# Patient Record
Sex: Male | Born: 1955 | Race: Black or African American | Hispanic: No | Marital: Married | Smoking: Never smoker
Health system: Southern US, Community
[De-identification: ages and names within clinical notes are randomized; demographics above are authoritative.]

## PROBLEM LIST (undated history)

## (undated) DIAGNOSIS — M1990 Unspecified osteoarthritis, unspecified site: Secondary | ICD-10-CM

## (undated) DIAGNOSIS — C801 Malignant (primary) neoplasm, unspecified: Secondary | ICD-10-CM

## (undated) DIAGNOSIS — K4090 Unilateral inguinal hernia, without obstruction or gangrene, not specified as recurrent: Secondary | ICD-10-CM

## (undated) DIAGNOSIS — R05 Cough: Secondary | ICD-10-CM

## (undated) DIAGNOSIS — T464X5A Adverse effect of angiotensin-converting-enzyme inhibitors, initial encounter: Secondary | ICD-10-CM

## (undated) DIAGNOSIS — M545 Low back pain, unspecified: Secondary | ICD-10-CM

## (undated) DIAGNOSIS — R809 Proteinuria, unspecified: Secondary | ICD-10-CM

## (undated) DIAGNOSIS — I10 Essential (primary) hypertension: Secondary | ICD-10-CM

## (undated) DIAGNOSIS — T7840XA Allergy, unspecified, initial encounter: Secondary | ICD-10-CM

## (undated) DIAGNOSIS — Z91010 Allergy to peanuts: Secondary | ICD-10-CM

## (undated) DIAGNOSIS — E119 Type 2 diabetes mellitus without complications: Secondary | ICD-10-CM

## (undated) DIAGNOSIS — F419 Anxiety disorder, unspecified: Secondary | ICD-10-CM

## (undated) DIAGNOSIS — E785 Hyperlipidemia, unspecified: Secondary | ICD-10-CM

## (undated) HISTORY — DX: Essential (primary) hypertension: I10

## (undated) HISTORY — DX: Adverse effect of angiotensin-converting-enzyme inhibitors, initial encounter: T46.4X5A

## (undated) HISTORY — DX: Type 2 diabetes mellitus without complications: E11.9

## (undated) HISTORY — DX: Unspecified osteoarthritis, unspecified site: M19.90

## (undated) HISTORY — DX: Allergy, unspecified, initial encounter: T78.40XA

## (undated) HISTORY — DX: Cough: R05

## (undated) HISTORY — DX: Hyperlipidemia, unspecified: E78.5

## (undated) HISTORY — DX: Proteinuria, unspecified: R80.9

## (undated) HISTORY — DX: Low back pain, unspecified: M54.50

## (undated) HISTORY — DX: Low back pain: M54.5

---
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
## 1994-05-04 HISTORY — PX: OTHER SURGICAL HISTORY: SHX169

## 1996-05-04 HISTORY — PX: NOSE SURGERY: SHX723

## 1997-05-04 HISTORY — PX: HERNIA REPAIR: SHX51

## 1998-08-08 ENCOUNTER — Emergency Department (HOSPITAL_COMMUNITY): Admission: EM | Admit: 1998-08-08 | Discharge: 1998-08-08 | Payer: Self-pay | Admitting: Emergency Medicine

## 1998-08-30 ENCOUNTER — Ambulatory Visit (HOSPITAL_BASED_OUTPATIENT_CLINIC_OR_DEPARTMENT_OTHER): Admission: RE | Admit: 1998-08-30 | Discharge: 1998-08-30 | Payer: Self-pay | Admitting: General Surgery

## 2000-10-16 ENCOUNTER — Emergency Department (HOSPITAL_COMMUNITY): Admission: EM | Admit: 2000-10-16 | Discharge: 2000-10-16 | Payer: Self-pay | Admitting: Emergency Medicine

## 2002-02-14 ENCOUNTER — Encounter: Payer: Self-pay | Admitting: Emergency Medicine

## 2002-02-14 ENCOUNTER — Emergency Department (HOSPITAL_COMMUNITY): Admission: EM | Admit: 2002-02-14 | Discharge: 2002-02-14 | Payer: Self-pay | Admitting: Emergency Medicine

## 2003-02-16 ENCOUNTER — Ambulatory Visit (HOSPITAL_COMMUNITY): Admission: RE | Admit: 2003-02-16 | Discharge: 2003-02-16 | Payer: Self-pay | Admitting: Orthopedic Surgery

## 2003-02-16 ENCOUNTER — Ambulatory Visit (HOSPITAL_BASED_OUTPATIENT_CLINIC_OR_DEPARTMENT_OTHER): Admission: RE | Admit: 2003-02-16 | Discharge: 2003-02-16 | Payer: Self-pay | Admitting: Orthopedic Surgery

## 2004-05-04 HISTORY — PX: OTHER SURGICAL HISTORY: SHX169

## 2014-05-04 DIAGNOSIS — R058 Other specified cough: Secondary | ICD-10-CM

## 2014-05-04 DIAGNOSIS — R05 Cough: Secondary | ICD-10-CM | POA: Insufficient documentation

## 2014-05-04 DIAGNOSIS — T464X5A Adverse effect of angiotensin-converting-enzyme inhibitors, initial encounter: Secondary | ICD-10-CM

## 2014-05-04 HISTORY — DX: Adverse effect of angiotensin-converting-enzyme inhibitors, initial encounter: T46.4X5A

## 2014-05-04 HISTORY — DX: Other specified cough: R05.8

## 2014-11-02 DIAGNOSIS — I10 Essential (primary) hypertension: Secondary | ICD-10-CM | POA: Insufficient documentation

## 2014-11-02 DIAGNOSIS — E119 Type 2 diabetes mellitus without complications: Secondary | ICD-10-CM

## 2014-11-02 DIAGNOSIS — R809 Proteinuria, unspecified: Secondary | ICD-10-CM

## 2014-11-02 HISTORY — DX: Proteinuria, unspecified: R80.9

## 2014-11-02 HISTORY — DX: Essential (primary) hypertension: I10

## 2014-11-02 HISTORY — DX: Type 2 diabetes mellitus without complications: E11.9

## 2015-02-18 ENCOUNTER — Ambulatory Visit: Payer: Self-pay | Admitting: Internal Medicine

## 2015-07-16 ENCOUNTER — Ambulatory Visit: Payer: Self-pay | Admitting: Internal Medicine

## 2015-12-17 ENCOUNTER — Other Ambulatory Visit: Payer: Self-pay | Admitting: Internal Medicine

## 2017-11-26 ENCOUNTER — Other Ambulatory Visit: Payer: Self-pay

## 2017-11-26 ENCOUNTER — Emergency Department (HOSPITAL_COMMUNITY): Payer: Self-pay

## 2017-11-26 ENCOUNTER — Emergency Department (HOSPITAL_COMMUNITY)
Admission: EM | Admit: 2017-11-26 | Discharge: 2017-11-26 | Disposition: A | Discharge status: Home / Self Care | Payer: Self-pay | Attending: Emergency Medicine | Admitting: Emergency Medicine

## 2017-11-26 ENCOUNTER — Encounter (HOSPITAL_COMMUNITY): Payer: Self-pay | Admitting: Emergency Medicine

## 2017-11-26 DIAGNOSIS — K469 Unspecified abdominal hernia without obstruction or gangrene: Secondary | ICD-10-CM | POA: Insufficient documentation

## 2017-11-26 LAB — COMPREHENSIVE METABOLIC PANEL
ALBUMIN: 4.4 g/dL (ref 3.5–5.0)
ALK PHOS: 53 U/L (ref 38–126)
ALT: 34 U/L (ref 0–44)
AST: 22 U/L (ref 15–41)
Anion gap: 10 (ref 5–15)
BILIRUBIN TOTAL: 1.1 mg/dL (ref 0.3–1.2)
BUN: 8 mg/dL (ref 8–23)
CALCIUM: 9.5 mg/dL (ref 8.9–10.3)
CO2: 25 mmol/L (ref 22–32)
Chloride: 105 mmol/L (ref 98–111)
Creatinine, Ser: 1 mg/dL (ref 0.61–1.24)
GFR calc non Af Amer: >60 mL/min (ref >60)
GLUCOSE: 174 mg/dL — AB (ref 70–99)
Potassium: 4.1 mmol/L (ref 3.5–5.1)
SODIUM: 140 mmol/L (ref 135–145)
Total Protein: 7.6 g/dL (ref 6.5–8.1)

## 2017-11-26 LAB — URINALYSIS, ROUTINE W REFLEX MICROSCOPIC
BILIRUBIN URINE: NEGATIVE
Glucose, UA: 150 mg/dL — AB
HGB URINE DIPSTICK: NEGATIVE
Ketones, ur: NEGATIVE mg/dL
Leukocytes, UA: NEGATIVE
Nitrite: NEGATIVE
PROTEIN: NEGATIVE mg/dL
Specific Gravity, Urine: 1.016 (ref 1.005–1.030)
pH: 5 (ref 5.0–8.0)

## 2017-11-26 LAB — LIPASE, BLOOD: Lipase: 43 U/L (ref 11–51)

## 2017-11-26 LAB — CBC
HCT: 47.7 % (ref 39.0–52.0)
Hemoglobin: 15.5 g/dL (ref 13.0–17.0)
MCH: 31.1 pg (ref 26.0–34.0)
MCHC: 32.5 g/dL (ref 30.0–36.0)
MCV: 95.8 fL (ref 78.0–100.0)
PLATELETS: 206 10*3/uL (ref 150–400)
RBC: 4.98 MIL/uL (ref 4.22–5.81)
RDW: 11.9 % (ref 11.5–15.5)
WBC: 6.9 10*3/uL (ref 4.0–10.5)

## 2017-11-26 MED ORDER — DICYCLOMINE HCL 10 MG PO CAPS
10.0000 mg | ORAL_CAPSULE | Freq: Once | ORAL | Status: AC
  Administered 2017-11-26: 10 mg via ORAL
  Filled 2017-11-26: qty 1

## 2017-11-26 MED ORDER — ACETAMINOPHEN 325 MG PO TABS
650.0000 mg | ORAL_TABLET | Freq: Once | ORAL | Status: AC
  Administered 2017-11-26: 650 mg via ORAL
  Filled 2017-11-26: qty 2

## 2017-11-26 MED ORDER — IOHEXOL 300 MG/ML  SOLN
100.0000 mL | Freq: Once | INTRAMUSCULAR | Status: AC | PRN
  Administered 2017-11-26: 100 mL via INTRAVENOUS

## 2017-11-26 MED ORDER — DICYCLOMINE HCL 20 MG PO TABS: 20.0000 mg | ORAL_TABLET | Freq: Two times a day (BID) | ORAL | 0 refills | Status: DC

## 2017-11-26 NOTE — ED Provider Notes (Signed)
[facility] Provider Note   CSN: [id number] Arrival date & time: 11/26/17  3557     History   Chief Complaint Chief Complaint  Patient presents with  . Abdominal Pain    HPI Spencer Walton is a 62 y.o. male.  The history is provided by the patient and the spouse. No language interpreter was used.  Abdominal Pain       62 year old male with prior history of hernia repair presenting for evaluation of abdominal pain.  Patient patient report for more than a year, he has had recurrent discomfort to his left lower abdomen.  It happens almost on a daily basis where he felt something is pushing rubbing the side of his abdomen because of burning sensation.  Pain is waxing waning, but not worse than usual.  There is no associated fever, chills, nausea, vomiting, diarrhea, constipation, dysuria or pain with bowel movement.  No specific treatment tried at home aside from occasional ibuprofen.  He mentioned having an abdominal hernia repair many years ago and he is unsure the site of the hernia.  He is here at the urging of his wife who has been telling him to be evaluated for this condition 4 months.  He does not have a primary care provider.  He denies alcohol abuse or tobacco abuse.  He denies any abnormal weight changes, night sweats, or fever.  No history of cancer.  History reviewed. No pertinent past medical history.  There are no active problems to display for this patient.   Past Surgical History:  Procedure Laterality Date  . HERNIA REPAIR          Home Medications    Prior to Admission medications   Medication Sig Start Date End Date Taking? Authorizing Provider  ibuprofen (ADVIL,MOTRIN) 200 MG tablet Take 200 mg by mouth every 6 (six) hours as needed for moderate pain.   Yes [provider]    Family History No family history on file.  Social History Social History   Tobacco Use  . Smoking status: Not on file    Substance Use Topics  . Alcohol use: Not on file  . Drug use: Not on file     Allergies   Penicillins   Review of Systems Review of Systems  Gastrointestinal: Positive for abdominal pain.  All other systems reviewed and are negative.    Physical Exam Updated Vital Signs BP (!) 182/103 (BP Location: Right Arm)   Pulse (!) 101   Temp 98.9 F (37.2 C) (Oral)   Resp 18   Ht 6' (1.829 m)   Wt 86.2 kg (190 lb)   SpO2 99%   BMI 25.77 kg/m   Physical Exam  Constitutional: He appears well-developed and well-nourished. No distress.  HENT:  Head: Atraumatic.  Eyes: Conjunctivae are normal.  Neck: Neck supple.  Cardiovascular: Normal rate and regular rhythm.  Pulmonary/Chest: Effort normal and breath sounds normal.  Abdominal: Normal appearance. There is tenderness in the left lower quadrant. No hernia. Hernia confirmed negative in the right inguinal area and confirmed negative in the left inguinal area.  Protuberant abdomen without significant focal tenderness.  He exhibited very mild tenderness to his left lower abdominal wall  Genitourinary:  Genitourinary Comments: Chaperone present during exam.  No inguinal lymph adenopathy or inguinal hernia noted.  Uncircumcised penis free of lesion or rash.  Testicle are nontender with normal lie, normal scrotum.  Neurological: He is alert.  Skin: No rash noted.  Psychiatric:  He has a normal mood and affect.  Nursing note and vitals reviewed.    ED Treatments / Results  Labs (all labs ordered are listed, but only abnormal results are displayed) Labs Reviewed  COMPREHENSIVE METABOLIC PANEL - Abnormal; Notable for the following components:      Result Value   Glucose, Bld 174 (*)    All other components within normal limits  URINALYSIS, ROUTINE W REFLEX MICROSCOPIC - Abnormal; Notable for the following components:   Glucose, UA 150 (*)    All other components within normal limits  LIPASE, BLOOD  CBC    EKG None     Radiology Ct Abdomen Pelvis W Contrast  Result Date: 11/26/2017 CLINICAL DATA:  Left-sided abdominal pain for the past year, worse during the past month. EXAM: CT ABDOMEN AND PELVIS WITH CONTRAST TECHNIQUE: Multidetector CT imaging of the abdomen and pelvis was performed using the standard protocol following bolus administration of intravenous contrast. CONTRAST:  173mL OMNIPAQUE IOHEXOL 300 MG/ML  SOLN COMPARISON:  None. FINDINGS: Lower chest: Limited visualization of the lower thorax demonstrates minimal dependent subpleural ground-glass atelectasis. No focal airspace opacities. No pleural effusion. Normal heart size.  No pericardial effusion. Hepatobiliary: Normal hepatic contour. No discrete hepatic lesions. Normal appearance of the gallbladder given degree of distention. No intra or extrahepatic biliary ductal dilatation. No ascites. Pancreas: Normal appearance of the pancreas Spleen: Normal appearance of the spleen Adrenals/Urinary Tract: There is symmetric enhancement and excretion of the bilateral kidneys. No definite renal stones on this postcontrast examination. Punctate subcentimeter right-sided renal lesion is too small to adequately characterize of favored to represent a renal cyst. No discrete left-sided renal lesions. There is a minimal amount of presumably age and body habitus related perinephric stranding. No urinary obstruction. Normal appearance the bilateral adrenal glands. Normal appearance of the urinary bladder given degree distention. Stomach/Bowel: Nonobstructive bowel gas pattern. Normal appearance of the terminal ileum and retrocecal appendix. No pneumoperitoneum, pneumatosis or portal venous gas. Vascular/Lymphatic: Scattered atherosclerotic plaque within a normal caliber abdominal aorta, not resulting in a hemodynamically significant stenosis. The major branch vessels of the abdominal aorta appear widely patent on this non CTA examination. No bulky retroperitoneal, mesenteric,  pelvic or inguinal lymphadenopathy. Reproductive: Dystrophic calcifications within a borderline enlarged prostate gland. No free fluid within the pelvic cul-de-sac. Other: Small left-sided mesenteric fat containing indirect inguinal hernia (images 77 through 89, series 3). Musculoskeletal: No acute or aggressive osseous abnormalities. Mild (approximately 25%) compression deformity involving the anterior aspect of the inferior endplate of the L1 vertebral body without associated fracture line or paraspinal hematoma. There is partial ankylosis of the L4-L5 intervertebral disc space. Stigmata of DISH within the thoracic spine. Moderate severe degenerative change the bilateral hips with joint space loss, subchondral sclerosis, osteophytosis and minimal amount of grossly symmetric bilateral axial migration. Note is made of a small right-sided os acetabuli. IMPRESSION: 1. Small left-sided mesenteric fat containing inguinal hernia. Otherwise, no explanation for patient's left sided abdominal pain. 2.  Aortic Atherosclerosis (ICD10-I70.0). Electronically Signed   By: Sandi Mariscal M.D.   On: 11/26/2017 13:04    Procedures Procedures (including critical care time)  Medications Ordered in ED Medications  iohexol (OMNIPAQUE) 300 MG/ML solution 100 mL (100 mLs Intravenous Contrast Given 11/26/17 1244)  dicyclomine (BENTYL) capsule 10 mg (10 mg Oral Given 11/26/17 1333)  acetaminophen (TYLENOL) tablet 650 mg (650 mg Oral Given 11/26/17 1333)     Initial Impression / Assessment and Plan / ED Course  I have reviewed  the triage vital signs and the nursing notes.  Pertinent labs & imaging results that were available during my care of the patient were reviewed by me and considered in my medical decision making (see chart for details).     BP (!) 137/92   Pulse 83   Temp 98.9 F (37.2 C) (Oral)   Resp 18   Ht 6' (1.829 m)   Wt 86.2 kg (190 lb)   SpO2 96%   BMI 25.77 kg/m    Final Clinical Impressions(s) /  ED Diagnoses   Final diagnoses:  None    ED Discharge Orders    None     9:28 AM Patient with recurrent left lower abdominal pain for more than a year, history of hernia in the past of unusual location.  I query if this is an irritation of the hernia mesh from prior hernia repair.  He does not have any acute symptoms concerning for small bowel obstruction or infectious symptoms. He does not have any B symptoms.  Care discussed with DR. Sabra Heck, plan to obtain abd/pelvis CT  1:29 PM Patient's labs are reassuring, normal WBC, normal H&H, electrolytes panels are reassuring, mild hyper glycemia with a CBG of 174, normal lipase.  An abdominal and pelvis CT scan demonstrate a small left-sided mesenteric fat-containing inguinal hernia but otherwise no examination for patient's left-sided abdominal pain.  Patient made aware of findings.  No acute emergent medical condition identified.  Encourage patient to follow-up with primary care provider for further evaluation of his condition.  Bentyl prescribed to use as needed.  Recommend alcohol cessation.   Domenic Moras, PA-C 11/26/17 1420    Noemi Chapel, MD 11/27/17 2130

## 2017-11-26 NOTE — ED Triage Notes (Signed)
Patient complains of left sided abdominal pain for the last year, states pain got significant worse over the last month. Reports history of hernia repair, patient unable to recall location and year of repair. Patient alert, oriented, and ambulating independently with steady gait.

## 2018-01-07 ENCOUNTER — Ambulatory Visit: Payer: Self-pay | Admitting: Internal Medicine

## 2018-01-07 ENCOUNTER — Encounter: Payer: Self-pay | Admitting: Internal Medicine

## 2018-01-07 VITALS — BP 180/100 | HR 84 | Resp 12 | Ht 69.5 in | Wt 200.0 lb

## 2018-01-07 DIAGNOSIS — M545 Low back pain, unspecified: Secondary | ICD-10-CM | POA: Insufficient documentation

## 2018-01-07 DIAGNOSIS — I10 Essential (primary) hypertension: Secondary | ICD-10-CM

## 2018-01-07 DIAGNOSIS — E1169 Type 2 diabetes mellitus with other specified complication: Secondary | ICD-10-CM

## 2018-01-07 DIAGNOSIS — T464X5A Adverse effect of angiotensin-converting-enzyme inhibitors, initial encounter: Secondary | ICD-10-CM

## 2018-01-07 DIAGNOSIS — G8929 Other chronic pain: Secondary | ICD-10-CM

## 2018-01-07 DIAGNOSIS — K4090 Unilateral inguinal hernia, without obstruction or gangrene, not specified as recurrent: Secondary | ICD-10-CM

## 2018-01-07 DIAGNOSIS — E669 Obesity, unspecified: Secondary | ICD-10-CM

## 2018-01-07 DIAGNOSIS — R05 Cough: Secondary | ICD-10-CM

## 2018-01-07 DIAGNOSIS — R809 Proteinuria, unspecified: Secondary | ICD-10-CM

## 2018-01-07 DIAGNOSIS — R058 Other specified cough: Secondary | ICD-10-CM

## 2018-01-07 LAB — GLUCOSE, POCT (MANUAL RESULT ENTRY): POC Glucose: 168 mg/dl — AB (ref 70–99)

## 2018-01-07 MED ORDER — CYCLOBENZAPRINE HCL 10 MG PO TABS
10.0000 mg | ORAL_TABLET | Freq: Three times a day (TID) | ORAL | 1 refills | Status: DC | PRN
Start: 2018-01-07 — End: 2018-10-11

## 2018-01-07 MED ORDER — LOSARTAN POTASSIUM 50 MG PO TABS: 50.0000 mg | ORAL_TABLET | Freq: Every day | ORAL | 11 refills | Status: DC

## 2018-01-07 MED ORDER — DICYCLOMINE HCL 20 MG PO TABS: ORAL_TABLET | ORAL | 1 refills | Status: DC

## 2018-01-07 MED ORDER — METFORMIN HCL 500 MG PO TABS: ORAL_TABLET | ORAL | 11 refills | Status: DC

## 2018-01-07 NOTE — Progress Notes (Signed)
Patient ID: Spencer Walton, male   DOB: [birth year], 62 y.o.   MRN: [id number]  Social work Administrator completed new patient screening with patient in order to assess for mental health concerns and/or problems with social determinants of health (food, housing, transportation, interpersonal violence). Pt reported that he had no issues with any SDOH. He denied any mental health symptoms besides feeling nervous about seeing the doctor.   No follow-up needed at this time.
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
## 2018-01-18 ENCOUNTER — Encounter: Payer: Self-pay | Admitting: Internal Medicine

## 2018-02-04 ENCOUNTER — Other Ambulatory Visit: Payer: Self-pay

## 2018-02-04 VITALS — BP 124/80 | HR 72

## 2018-02-04 DIAGNOSIS — I10 Essential (primary) hypertension: Secondary | ICD-10-CM

## 2018-02-04 NOTE — Progress Notes (Signed)
Patient BP now in normal range. Informed to continue current dose of medication. Patient verbalized understanding. 

## 2018-02-05 LAB — BASIC METABOLIC PANEL
BUN / CREAT RATIO: 13 (ref 10–24)
BUN: 13 mg/dL (ref 8–27)
CALCIUM: 9.7 mg/dL (ref 8.6–10.2)
CO2: 21 mmol/L (ref 20–29)
Chloride: 98 mmol/L (ref 96–106)
Creatinine, Ser: 0.97 mg/dL (ref 0.76–1.27)
GFR, EST AFRICAN AMERICAN: 97 mL/min/{1.73_m2} (ref >59)
GFR, EST NON AFRICAN AMERICAN: 84 mL/min/{1.73_m2} (ref >59)
Glucose: 178 mg/dL — ABNORMAL HIGH (ref 65–99)
POTASSIUM: 4.4 mmol/L (ref 3.5–5.2)
Sodium: 139 mmol/L (ref 134–144)

## 2018-03-04 ENCOUNTER — Ambulatory Visit: Payer: Self-pay | Admitting: Internal Medicine

## 2018-05-05 ENCOUNTER — Other Ambulatory Visit: Payer: Self-pay

## 2018-05-05 ENCOUNTER — Emergency Department (HOSPITAL_COMMUNITY)
Admission: EM | Admit: 2018-05-05 | Discharge: 2018-05-06 | Disposition: A | Discharge status: Home / Self Care | Payer: Medicaid Other | Attending: Emergency Medicine | Admitting: Emergency Medicine

## 2018-05-05 DIAGNOSIS — E119 Type 2 diabetes mellitus without complications: Secondary | ICD-10-CM | POA: Diagnosis not present

## 2018-05-05 DIAGNOSIS — I10 Essential (primary) hypertension: Secondary | ICD-10-CM | POA: Insufficient documentation

## 2018-05-05 DIAGNOSIS — T782XXA Anaphylactic shock, unspecified, initial encounter: Secondary | ICD-10-CM

## 2018-05-05 DIAGNOSIS — T7840XA Allergy, unspecified, initial encounter: Secondary | ICD-10-CM | POA: Diagnosis present

## 2018-05-05 DIAGNOSIS — Z79899 Other long term (current) drug therapy: Secondary | ICD-10-CM | POA: Insufficient documentation

## 2018-05-05 MED ORDER — FAMOTIDINE IN NACL 20-0.9 MG/50ML-% IV SOLN
20.0000 mg | Freq: Once | INTRAVENOUS | Status: AC
  Administered 2018-05-05: 20 mg via INTRAVENOUS
  Filled 2018-05-05: qty 50

## 2018-05-05 MED ORDER — METHYLPREDNISOLONE SODIUM SUCC 125 MG IJ SOLR
125.0000 mg | Freq: Once | INTRAMUSCULAR | Status: AC
  Administered 2018-05-05: 125 mg via INTRAVENOUS
  Filled 2018-05-05: qty 2

## 2018-05-05 MED ORDER — DIPHENHYDRAMINE HCL 50 MG/ML IJ SOLN
25.0000 mg | Freq: Once | INTRAMUSCULAR | Status: AC
  Administered 2018-05-05: 25 mg via INTRAVENOUS
  Filled 2018-05-05: qty 1

## 2018-05-05 MED ORDER — SODIUM CHLORIDE 0.9 % IV BOLUS
1000.0000 mL | Freq: Once | INTRAVENOUS | Status: AC
  Administered 2018-05-05: 1000 mL via INTRAVENOUS

## 2018-05-05 NOTE — ED Triage Notes (Signed)
Pt coming by EMS after allergic reaction to eating pecans ([employer] candy bar). Pt developed hives, lip, throat and tongue swelling. Pt was unaware of allergy. Gave PO Benadryl on site by EMS, with no relief then given 0.3 of Epi and 50 mg IV of Benadryl. Hypertensive but has been off BP meds for several days due to no transportation to pharmacy. Pt alert, oriented, and airway is secure.

## 2018-05-05 NOTE — Discharge Instructions (Addendum)
Take the prescribed medication as directed.  Try and get to the pharmacy to pick up your BP medications. If epi-pen used, you need to come to the ED for evaluation. Follow-up with your primary care doctor. Return to the ED for new or worsening symptoms.
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
## 2018-05-06 LAB — BASIC METABOLIC PANEL
ANION GAP: 11 (ref 5–15)
BUN: 13 mg/dL (ref 8–23)
CHLORIDE: 103 mmol/L (ref 98–111)
CO2: 21 mmol/L — ABNORMAL LOW (ref 22–32)
Calcium: 8.9 mg/dL (ref 8.9–10.3)
Creatinine, Ser: 0.91 mg/dL (ref 0.61–1.24)
GFR calc Af Amer: >60 mL/min (ref >60)
Glucose, Bld: 222 mg/dL — ABNORMAL HIGH (ref 70–99)
POTASSIUM: 3.9 mmol/L (ref 3.5–5.1)
SODIUM: 135 mmol/L (ref 135–145)

## 2018-05-06 LAB — CBC
HEMATOCRIT: 45.1 % (ref 39.0–52.0)
HEMOGLOBIN: 14.9 g/dL (ref 13.0–17.0)
MCH: 30.7 pg (ref 26.0–34.0)
MCHC: 33 g/dL (ref 30.0–36.0)
MCV: 92.8 fL (ref 80.0–100.0)
Platelets: 220 10*3/uL (ref 150–400)
RBC: 4.86 MIL/uL (ref 4.22–5.81)
RDW: 11.9 % (ref 11.5–15.5)
WBC: 12.5 10*3/uL — AB (ref 4.0–10.5)
nRBC: 0 % (ref 0.0–0.2)

## 2018-05-06 MED ORDER — LOSARTAN POTASSIUM 50 MG PO TABS
50.0000 mg | ORAL_TABLET | Freq: Once | ORAL | Status: AC
  Administered 2018-05-06: 50 mg via ORAL
  Filled 2018-05-06 (×2): qty 1

## 2018-05-06 MED ORDER — EPINEPHRINE 0.3 MG/0.3ML IJ SOAJ: 0.3000 mg | Freq: Once | INTRAMUSCULAR | 1 refills | Status: DC | PRN

## 2018-05-06 MED ORDER — PREDNISONE 20 MG PO TABS: ORAL_TABLET | ORAL | 0 refills | Status: DC

## 2018-05-06 NOTE — ED Notes (Signed)
Patient verbalizes understanding of discharge instructions. Opportunity for questioning and answers were provided. Armband removed by staff, pt discharged from ED ambulatory.   
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
## 2018-06-30 ENCOUNTER — Ambulatory Visit: Payer: Self-pay | Admitting: Surgery

## 2018-06-30 NOTE — H&P (Signed)
History of Present Illness Spencer Walton. Spencer Walton; 06/30/2018 6:52 PM) The patient is a 63 year old male who presents with an inguinal hernia. Referred by [name] for left inguinal hernia  This is a 63 year old male who is status post right inguinal hernia repair many years ago who presents with several years of slowly worsening left lower abdominal/left groin pain. He underwent a CT scan in July 2019 which showed a small left inguinal hernia containing some fat. On review of the CT scan, the patient also has a small umbilical hernia. The patient has had previous back surgery and continues to have a lot of pain coming from his back. He denies any obstructive symptoms. He presents now to discuss hernia repair.  CLINICAL DATA: Left-sided abdominal pain for the past year, worse during the past month.  EXAM: CT ABDOMEN AND PELVIS WITH CONTRAST  TECHNIQUE: Multidetector CT imaging of the abdomen and pelvis was performed using the standard protocol following bolus administration of intravenous contrast.  CONTRAST: 134mL OMNIPAQUE IOHEXOL 300 MG/ML SOLN  COMPARISON: None.  FINDINGS: Lower chest: Limited visualization of the lower thorax demonstrates minimal dependent subpleural ground-glass atelectasis. No focal airspace opacities. No pleural effusion.  Normal heart size. No pericardial effusion.  Hepatobiliary: Normal hepatic contour. No discrete hepatic lesions. Normal appearance of the gallbladder given degree of distention. No intra or extrahepatic biliary ductal dilatation. No ascites.  Pancreas: Normal appearance of the pancreas  Spleen: Normal appearance of the spleen  Adrenals/Urinary Tract: There is symmetric enhancement and excretion of the bilateral kidneys. No definite renal stones on this postcontrast examination. Punctate subcentimeter right-sided renal lesion is too small to adequately characterize of favored to represent a renal cyst. No discrete  left-sided renal lesions. There is a minimal amount of presumably age and body habitus related perinephric stranding. No urinary obstruction.  Normal appearance the bilateral adrenal glands. Normal appearance of the urinary bladder given degree distention.  Stomach/Bowel: Nonobstructive bowel gas pattern. Normal appearance of the terminal ileum and retrocecal appendix. No pneumoperitoneum, pneumatosis or portal venous gas.  Vascular/Lymphatic: Scattered atherosclerotic plaque within a normal caliber abdominal aorta, not resulting in a hemodynamically significant stenosis. The major branch vessels of the abdominal aorta appear widely patent on this non CTA examination.  No bulky retroperitoneal, mesenteric, pelvic or inguinal lymphadenopathy.  Reproductive: Dystrophic calcifications within a borderline enlarged prostate gland. No free fluid within the pelvic cul-de-sac.  Other: Small left-sided mesenteric fat containing indirect inguinal hernia (images 77 through 89, series 3).  Musculoskeletal: No acute or aggressive osseous abnormalities. Mild (approximately 25%) compression deformity involving the anterior aspect of the inferior endplate of the L1 vertebral body without associated fracture line or paraspinal hematoma. There is partial ankylosis of the L4-L5 intervertebral disc space. Stigmata of DISH within the thoracic spine. Moderate severe degenerative change the bilateral hips with joint space loss, subchondral sclerosis, osteophytosis and minimal amount of grossly symmetric bilateral axial migration. Note is made of a small right-sided os acetabuli.  IMPRESSION: 1. Small left-sided mesenteric fat containing inguinal hernia. Otherwise, no explanation for patient's left sided abdominal pain. 2. Aortic Atherosclerosis (ICD10-I70.0).   Electronically Signed By: Sandi Mariscal M.D. On: 11/26/2017 13:04   Past Surgical History Spencer Walton, RMA; 06/30/2018 3:32  PM) Open Inguinal Hernia Surgery Left. Spinal Surgery Midback  Diagnostic Studies History Spencer Walton, RMA; 06/30/2018 3:32 PM) Colonoscopy never  Allergies Spencer Walton, RMA; 06/30/2018 3:33 PM) Penicillins Swelling. Allergies Reconciled  Medication History [employer], RMA; 06/30/2018 3:33 PM) Losartan Potassium (  50MG  Tablet, Oral) Active. metFORMIN HCl (500MG  Tablet, Oral) Active. Medications Reconciled  Social History Spencer Walton, RMA; 06/30/2018 3:32 PM) Alcohol use Moderate alcohol use. No caffeine use No drug use Tobacco use Never smoker.  Family History Spencer Walton, RMA; 06/30/2018 3:32 PM) Alcohol Abuse Father. Arthritis Father, Mother. Colon Cancer Mother. Colon Polyps Mother, Sister. Hypertension Father, Mother.  Other Problems Spencer Walton, RMA; 06/30/2018 3:32 PM) Back Pain Diabetes Mellitus Hemorrhoids High blood pressure Inguinal Hernia Umbilical Hernia Repair Ventral Hernia Repair     Review of Systems (Spencer Walton RMA; 06/30/2018 3:32 PM) General Not Present- Appetite Loss, Chills, Fatigue, Fever, Night Sweats, Weight Gain and Weight Loss. Skin Present- Change in Wart/Mole and Non-Healing Wounds. Not Present- Dryness, Hives, Jaundice, New Lesions, Rash and Ulcer. HEENT Present- Earache, Hoarseness and Wears glasses/contact lenses. Not Present- Hearing Loss, Nose Bleed, Oral Ulcers, Ringing in the Ears, Seasonal Allergies, Sinus Pain, Sore Throat, Visual Disturbances and Yellow Eyes. Respiratory Present- Difficulty Breathing. Not Present- Bloody sputum, Chronic Cough, Snoring and Wheezing. Cardiovascular Present- Leg Cramps. Not Present- Chest Pain, Difficulty Breathing Lying Down, Palpitations, Rapid Heart Rate, Shortness of Breath and Swelling of Extremities. Gastrointestinal Present- Abdominal Pain, Bloating, Excessive gas, Indigestion and Nausea. Not Present- Bloody Stool, Change  in Bowel Habits, Chronic diarrhea, Constipation, Difficulty Swallowing, Gets full quickly at meals, Hemorrhoids, Rectal Pain and Vomiting. Male Genitourinary Present- Frequency, Impotence, Urgency and Urine Leakage. Not Present- Blood in Urine, Change in Urinary Stream, Nocturia and Painful Urination. Musculoskeletal Present- Back Pain, Joint Pain, Joint Stiffness and Muscle Pain. Not Present- Muscle Weakness and Swelling of Extremities. Neurological Present- Decreased Memory, Headaches, Numbness, Tingling, Trouble walking and Weakness. Not Present- Fainting, Seizures and Tremor. Psychiatric Present- Anxiety and Fearful. Not Present- Bipolar, Change in Sleep Pattern, Depression and Frequent crying. Endocrine Present- New Diabetes. Not Present- Cold Intolerance, Excessive Hunger, Hair Changes, Heat Intolerance and Hot flashes. Hematology Not Present- Blood Thinners, Easy Bruising, Excessive bleeding, Gland problems, HIV and Persistent Infections.  Vitals Spencer Walton RMA; 06/30/2018 3:34 PM) 06/30/2018 3:33 PM Weight: 213.2 lb Height: 72in Body Surface Area: 2.19 m Body Mass Index: 28.91 kg/m  Temp.: 97.72F(Temporal)  Pulse: 106 (Regular)  P.OX: 98% (Room air) BP: 138/100 (Sitting, Left Arm, Standard)      Physical Exam Rodman Key K. Venesa Semidey Walton; 06/30/2018 6:53 PM)  The physical exam findings are as follows: Note:WDWN in NAD Eyes: Pupils equal, round; sclera anicteric HENT: Oral mucosa moist; good dentition Neck: No masses palpated, no thyromegaly Lungs: CTA bilaterally; normal respiratory effort CV: Regular rate and rhythm; no murmurs; extremities well-perfused with no edema Abd: +bowel sounds, soft, non-tender, no palpable organomegaly; small reducible umbilical hernia with 1 cm defect GU: bilateral descended testes; no testicular masses. Small reducible left inguinal hernia. Healed right inguinal incision with no sign of hernia Skin: Warm, dry; no sign of  jaundice Psychiatric - alert and oriented x 4; calm mood and affect    Assessment & Plan Rodman Key K. Kanylah Muench Walton; 06/30/2018 4:22 PM)  INGUINAL HERNIA OF LEFT SIDE WITHOUT OBSTRUCTION OR GANGRENE (L27.51)   UMBILICAL HERNIA WITHOUT OBSTRUCTION OR GANGRENE (K42.9)  Current Plans Schedule for Surgery - Left inguinal hernia repair with mesh. Umbilical hernia repair with mesh. The surgical procedure has been discussed with the patient. Potential risks, benefits, alternative treatments, and expected outcomes have been explained. All of the patient's questions at this time have been answered. The likelihood of reaching the patient's treatment goal is good. The patient understand the proposed surgical procedure and  wishes to proceed.  Spencer Walton. Georgette Dover, [place], [facility] Beeper [phone]  06/30/2018 6:53 PM

## 2018-07-05 ENCOUNTER — Ambulatory Visit: Payer: Self-pay | Admitting: Internal Medicine

## 2018-07-06 ENCOUNTER — Encounter (HOSPITAL_BASED_OUTPATIENT_CLINIC_OR_DEPARTMENT_OTHER): Payer: Self-pay | Admitting: *Deleted

## 2018-07-06 ENCOUNTER — Other Ambulatory Visit: Payer: Self-pay

## 2018-07-11 ENCOUNTER — Encounter (HOSPITAL_BASED_OUTPATIENT_CLINIC_OR_DEPARTMENT_OTHER)
Admission: RE | Admit: 2018-07-11 | Discharge: 2018-07-11 | Disposition: A | Discharge status: Home / Self Care | Payer: Self-pay | Source: Ambulatory Visit | Attending: Surgery | Admitting: Surgery

## 2018-07-11 ENCOUNTER — Encounter (HOSPITAL_BASED_OUTPATIENT_CLINIC_OR_DEPARTMENT_OTHER): Payer: Self-pay | Admitting: Anesthesiology

## 2018-07-11 DIAGNOSIS — K429 Umbilical hernia without obstruction or gangrene: Secondary | ICD-10-CM | POA: Diagnosis not present

## 2018-07-11 DIAGNOSIS — Z539 Procedure and treatment not carried out, unspecified reason: Secondary | ICD-10-CM | POA: Diagnosis not present

## 2018-07-11 DIAGNOSIS — K4090 Unilateral inguinal hernia, without obstruction or gangrene, not specified as recurrent: Secondary | ICD-10-CM | POA: Diagnosis not present

## 2018-07-11 LAB — BASIC METABOLIC PANEL
Anion gap: 9 (ref 5–15)
BUN: 14 mg/dL (ref 8–23)
CO2: 22 mmol/L (ref 22–32)
Calcium: 9.5 mg/dL (ref 8.9–10.3)
Chloride: 104 mmol/L (ref 98–111)
Creatinine, Ser: 0.83 mg/dL (ref 0.61–1.24)
GFR calc Af Amer: >60 mL/min (ref >60)
GFR calc non Af Amer: >60 mL/min (ref >60)
Glucose, Bld: 137 mg/dL — ABNORMAL HIGH (ref 70–99)
POTASSIUM: 4.2 mmol/L (ref 3.5–5.1)
Sodium: 135 mmol/L (ref 135–145)

## 2018-07-11 NOTE — Progress Notes (Signed)
Ensure pre surgery drink given with instructions to complete by 0700 dos, surgical soap given with instruction, pt verbalized understanding. Unable to obtain Anesthesia consult due to surgery schedule today, will be evaluated on day of surgery.

## 2018-07-11 NOTE — Anesthesia Preprocedure Evaluation (Deleted)
Anesthesia Evaluation    Reviewed: Allergy & Precautions, Patient's Chart, lab work & pertinent test results  Airway        Dental   Pulmonary neg pulmonary ROS,           Cardiovascular Exercise Tolerance: Good hypertension, Pt. on medications      Neuro/Psych negative neurological ROS  negative psych ROS   GI/Hepatic negative GI ROS, Neg liver ROS,   Endo/Other  diabetes, Well Controlled, Type 2  Renal/GU negative Renal ROS     Musculoskeletal   Abdominal   Peds  Hematology   Anesthesia Other Findings   Reproductive/Obstetrics                             Anesthesia Physical Anesthesia Plan  ASA: III  Anesthesia Plan: General   Post-op Pain Management:    Induction: Intravenous  PONV Risk Score and Plan: 3 and Treatment may vary due to age or medical condition, Ondansetron and Dexamethasone  Airway Management Planned: Oral ETT  Additional Equipment:   Intra-op Plan:   Post-operative Plan: Extubation in OR  Informed Consent:     Dental advisory given  Plan Discussed with:   Anesthesia Plan Comments:         Anesthesia Quick Evaluation

## 2018-07-12 ENCOUNTER — Encounter (HOSPITAL_BASED_OUTPATIENT_CLINIC_OR_DEPARTMENT_OTHER)
Admission: RE | Disposition: A | Discharge status: Home / Self Care | Payer: Self-pay | Source: Home / Self Care | Attending: Surgery

## 2018-07-12 ENCOUNTER — Ambulatory Visit (HOSPITAL_BASED_OUTPATIENT_CLINIC_OR_DEPARTMENT_OTHER)
Admission: RE | Admit: 2018-07-12 | Discharge: 2018-07-12 | Disposition: A | Discharge status: Home / Self Care | Payer: Medicaid Other | Attending: Surgery | Admitting: Surgery

## 2018-07-12 DIAGNOSIS — K4090 Unilateral inguinal hernia, without obstruction or gangrene, not specified as recurrent: Secondary | ICD-10-CM | POA: Insufficient documentation

## 2018-07-12 DIAGNOSIS — Z539 Procedure and treatment not carried out, unspecified reason: Secondary | ICD-10-CM | POA: Insufficient documentation

## 2018-07-12 DIAGNOSIS — K429 Umbilical hernia without obstruction or gangrene: Secondary | ICD-10-CM | POA: Insufficient documentation

## 2018-07-12 HISTORY — DX: Unilateral inguinal hernia, without obstruction or gangrene, not specified as recurrent: K40.90

## 2018-07-12 SURGERY — REPAIR, HERNIA, INGUINAL, ADULT
Anesthesia: General

## 2018-07-12 MED ORDER — FENTANYL CITRATE (PF) 100 MCG/2ML IJ SOLN: 50.0000 ug | INTRAMUSCULAR | Status: DC | PRN

## 2018-07-12 MED ORDER — FENTANYL CITRATE (PF) 100 MCG/2ML IJ SOLN
INTRAMUSCULAR | Status: AC
  Filled 2018-07-12: qty 2

## 2018-07-12 MED ORDER — SCOPOLAMINE 1 MG/3DAYS TD PT72: 1.0000 | MEDICATED_PATCH | Freq: Once | TRANSDERMAL | Status: DC | PRN

## 2018-07-12 MED ORDER — LACTATED RINGERS IV SOLN: INTRAVENOUS | Status: DC

## 2018-07-12 MED ORDER — DEXAMETHASONE SODIUM PHOSPHATE 10 MG/ML IJ SOLN
INTRAMUSCULAR | Status: AC
  Filled 2018-07-12: qty 1

## 2018-07-12 MED ORDER — MIDAZOLAM HCL 2 MG/2ML IJ SOLN
INTRAMUSCULAR | Status: AC
  Filled 2018-07-12: qty 2

## 2018-07-12 MED ORDER — ACETAMINOPHEN 500 MG PO TABS: 1000.0000 mg | ORAL_TABLET | Freq: Once | ORAL | Status: DC

## 2018-07-12 MED ORDER — ONDANSETRON HCL 4 MG/2ML IJ SOLN
INTRAMUSCULAR | Status: AC
  Filled 2018-07-12: qty 2

## 2018-07-12 MED ORDER — VANCOMYCIN HCL IN DEXTROSE 1-5 GM/200ML-% IV SOLN: 1000.0000 mg | INTRAVENOUS | Status: DC

## 2018-07-12 MED ORDER — CHLORHEXIDINE GLUCONATE CLOTH 2 % EX PADS: 6.0000 | MEDICATED_PAD | Freq: Once | CUTANEOUS | Status: DC

## 2018-07-12 MED ORDER — MIDAZOLAM HCL 2 MG/2ML IJ SOLN: 1.0000 mg | INTRAMUSCULAR | Status: DC | PRN

## 2018-07-15 ENCOUNTER — Ambulatory Visit: Payer: Self-pay | Admitting: Internal Medicine

## 2018-07-21 ENCOUNTER — Ambulatory Visit (HOSPITAL_COMMUNITY)
Admission: EM | Admit: 2018-07-21 | Discharge: 2018-07-21 | Discharge status: Against Medical Advice | Payer: Self-pay | Attending: Family Medicine | Admitting: Family Medicine

## 2018-07-21 ENCOUNTER — Emergency Department (HOSPITAL_COMMUNITY): Payer: Medicaid Other

## 2018-07-21 ENCOUNTER — Encounter (HOSPITAL_COMMUNITY): Payer: Self-pay | Admitting: Family Medicine

## 2018-07-21 ENCOUNTER — Emergency Department (HOSPITAL_COMMUNITY)
Admission: EM | Admit: 2018-07-21 | Discharge: 2018-07-21 | Disposition: A | Discharge status: Home / Self Care | Payer: Medicaid Other | Attending: Emergency Medicine | Admitting: Emergency Medicine

## 2018-07-21 ENCOUNTER — Other Ambulatory Visit: Payer: Self-pay

## 2018-07-21 ENCOUNTER — Encounter (HOSPITAL_COMMUNITY): Payer: Self-pay

## 2018-07-21 DIAGNOSIS — I10 Essential (primary) hypertension: Secondary | ICD-10-CM | POA: Diagnosis not present

## 2018-07-21 DIAGNOSIS — Z79899 Other long term (current) drug therapy: Secondary | ICD-10-CM | POA: Diagnosis not present

## 2018-07-21 DIAGNOSIS — Z7984 Long term (current) use of oral hypoglycemic drugs: Secondary | ICD-10-CM | POA: Diagnosis not present

## 2018-07-21 DIAGNOSIS — E119 Type 2 diabetes mellitus without complications: Secondary | ICD-10-CM | POA: Diagnosis not present

## 2018-07-21 DIAGNOSIS — R05 Cough: Secondary | ICD-10-CM | POA: Diagnosis present

## 2018-07-21 DIAGNOSIS — Z91010 Allergy to peanuts: Secondary | ICD-10-CM

## 2018-07-21 DIAGNOSIS — R053 Chronic cough: Secondary | ICD-10-CM

## 2018-07-21 HISTORY — DX: Allergy to peanuts: Z91.010

## 2018-07-21 LAB — BASIC METABOLIC PANEL
ANION GAP: 12 (ref 5–15)
BUN: 17 mg/dL (ref 8–23)
CO2: 20 mmol/L — ABNORMAL LOW (ref 22–32)
Calcium: 9.5 mg/dL (ref 8.9–10.3)
Chloride: 106 mmol/L (ref 98–111)
Creatinine, Ser: 0.96 mg/dL (ref 0.61–1.24)
GFR calc non Af Amer: >60 mL/min (ref >60)
Glucose, Bld: 119 mg/dL — ABNORMAL HIGH (ref 70–99)
Potassium: 3.8 mmol/L (ref 3.5–5.1)
SODIUM: 138 mmol/L (ref 135–145)

## 2018-07-21 LAB — CBC
HCT: 45.4 % (ref 39.0–52.0)
Hemoglobin: 14.5 g/dL (ref 13.0–17.0)
MCH: 30.2 pg (ref 26.0–34.0)
MCHC: 31.9 g/dL (ref 30.0–36.0)
MCV: 94.6 fL (ref 80.0–100.0)
NRBC: 0 % (ref 0.0–0.2)
Platelets: 310 10*3/uL (ref 150–400)
RBC: 4.8 MIL/uL (ref 4.22–5.81)
RDW: 11.8 % (ref 11.5–15.5)
WBC: 7.1 10*3/uL (ref 4.0–10.5)

## 2018-07-21 LAB — I-STAT TROPONIN, ED: Troponin i, poc: 0 ng/mL (ref 0.00–0.08)

## 2018-07-21 MED ORDER — SODIUM CHLORIDE 0.9% FLUSH: 3.0000 mL | Freq: Once | INTRAVENOUS | Status: DC

## 2018-07-21 MED ORDER — CETIRIZINE HCL 10 MG PO CAPS: 10.0000 mg | ORAL_CAPSULE | Freq: Every day | ORAL | 0 refills | Status: DC

## 2018-07-21 NOTE — ED Triage Notes (Signed)
Pt here for chest pain for last 3 months.  Developed a cough 2 days ago.  A&Ox4, ambulatory to triage.
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
## 2018-07-21 NOTE — Discharge Instructions (Signed)
Please read and follow all provided instructions.  Your diagnoses today include:  1. Chronic cough     Tests performed today include:  An EKG of your heart  A chest x-ray - no pneumonia  Cardiac enzymes - a blood test for heart muscle damage  Blood counts and electrolytes  Vital signs. See below for your results today.   Medications prescribed:   Cetirizine - antihistamine  Take any prescribed medications only as directed.  Follow-up instructions: Please follow-up with your primary care provider as soon as you can for further evaluation of your symptoms. If you continue to have chest pains, you should see a cardiologist for evaluation of your heart.   Return instructions:  SEEK IMMEDIATE MEDICAL ATTENTION IF:  You have severe chest pain, especially if the pain is crushing or pressure-like and spreads to the arms, back, neck, or jaw, or if you have sweating, nausea (feeling sick to your stomach), or shortness of breath. THIS IS AN EMERGENCY. Don't wait to see if the pain will go away. Get medical help at once. Call 911 or 0 (operator). DO NOT drive yourself to the hospital.   Your chest pain gets worse and does not go away with rest.   You have an attack of chest pain lasting longer than usual, despite rest and treatment with the medications your caregiver has prescribed.   You wake from sleep with chest pain or shortness of breath.  You feel dizzy or faint.  You have chest pain not typical of your usual pain for which you originally saw your caregiver.   You have any other emergent concerns regarding your health.  Additional Information: Chest pain comes from many different causes. Your caregiver has diagnosed you as having chest pain that is not specific for one problem, but does not require admission.  You are at low risk for an acute heart condition or other serious illness.   Your vital signs today were: BP 128/89    Pulse 88    Temp 98.9 F (37.2 C) (Oral)     Resp 16    SpO2 100%  If your blood pressure (BP) was elevated above 135/85 this visit, please have this repeated by your doctor within one month. --------------

## 2018-07-22 ENCOUNTER — Ambulatory Visit: Payer: Self-pay | Admitting: Internal Medicine

## 2018-09-29 ENCOUNTER — Ambulatory Visit: Payer: Self-pay | Admitting: Surgery

## 2018-10-11 ENCOUNTER — Other Ambulatory Visit: Payer: Self-pay

## 2018-10-11 ENCOUNTER — Encounter (HOSPITAL_BASED_OUTPATIENT_CLINIC_OR_DEPARTMENT_OTHER): Payer: Self-pay | Admitting: *Deleted

## 2018-10-14 ENCOUNTER — Encounter (HOSPITAL_BASED_OUTPATIENT_CLINIC_OR_DEPARTMENT_OTHER)
Admission: RE | Admit: 2018-10-14 | Discharge: 2018-10-14 | Disposition: A | Discharge status: Home / Self Care | Payer: Medicaid Other | Source: Ambulatory Visit | Attending: Surgery | Admitting: Surgery

## 2018-10-14 ENCOUNTER — Other Ambulatory Visit (HOSPITAL_COMMUNITY)
Admission: RE | Admit: 2018-10-14 | Discharge: 2018-10-14 | Disposition: A | Discharge status: Home / Self Care | Payer: Medicaid Other | Source: Ambulatory Visit | Attending: Surgery | Admitting: Surgery

## 2018-10-14 DIAGNOSIS — Z01812 Encounter for preprocedural laboratory examination: Secondary | ICD-10-CM | POA: Insufficient documentation

## 2018-10-14 DIAGNOSIS — Z1159 Encounter for screening for other viral diseases: Secondary | ICD-10-CM | POA: Insufficient documentation

## 2018-10-14 LAB — BASIC METABOLIC PANEL
Anion gap: 10 (ref 5–15)
BUN: 18 mg/dL (ref 8–23)
CO2: 24 mmol/L (ref 22–32)
Calcium: 9.7 mg/dL (ref 8.9–10.3)
Chloride: 104 mmol/L (ref 98–111)
Creatinine, Ser: 0.9 mg/dL (ref 0.61–1.24)
GFR calc Af Amer: >60 mL/min (ref >60)
GFR calc non Af Amer: >60 mL/min (ref >60)
Glucose, Bld: 136 mg/dL — ABNORMAL HIGH (ref 70–99)
Potassium: 4.4 mmol/L (ref 3.5–5.1)
Sodium: 138 mmol/L (ref 135–145)

## 2018-10-15 LAB — NOVEL CORONAVIRUS, NAA (HOSP ORDER, SEND-OUT TO REF LAB; TAT 18-24 HRS): SARS-CoV-2, NAA: NOT DETECTED

## 2018-10-18 ENCOUNTER — Other Ambulatory Visit: Payer: Self-pay

## 2018-10-18 ENCOUNTER — Ambulatory Visit (HOSPITAL_BASED_OUTPATIENT_CLINIC_OR_DEPARTMENT_OTHER): Payer: Medicaid Other | Admitting: Anesthesiology

## 2018-10-18 ENCOUNTER — Ambulatory Visit (HOSPITAL_BASED_OUTPATIENT_CLINIC_OR_DEPARTMENT_OTHER)
Admission: RE | Admit: 2018-10-18 | Discharge: 2018-10-18 | Disposition: A | Discharge status: Home / Self Care | Payer: Medicaid Other | Attending: Surgery | Admitting: Surgery

## 2018-10-18 ENCOUNTER — Encounter (HOSPITAL_BASED_OUTPATIENT_CLINIC_OR_DEPARTMENT_OTHER): Payer: Self-pay

## 2018-10-18 ENCOUNTER — Encounter (HOSPITAL_BASED_OUTPATIENT_CLINIC_OR_DEPARTMENT_OTHER)
Admission: RE | Disposition: A | Discharge status: Home / Self Care | Payer: Self-pay | Source: Home / Self Care | Attending: Surgery

## 2018-10-18 DIAGNOSIS — E119 Type 2 diabetes mellitus without complications: Secondary | ICD-10-CM | POA: Diagnosis not present

## 2018-10-18 DIAGNOSIS — K4090 Unilateral inguinal hernia, without obstruction or gangrene, not specified as recurrent: Secondary | ICD-10-CM | POA: Diagnosis not present

## 2018-10-18 DIAGNOSIS — I10 Essential (primary) hypertension: Secondary | ICD-10-CM | POA: Diagnosis not present

## 2018-10-18 DIAGNOSIS — K429 Umbilical hernia without obstruction or gangrene: Secondary | ICD-10-CM | POA: Diagnosis not present

## 2018-10-18 HISTORY — PX: UMBILICAL HERNIA REPAIR: SHX196

## 2018-10-18 HISTORY — PX: INGUINAL HERNIA REPAIR: SHX194

## 2018-10-18 LAB — GLUCOSE, CAPILLARY
Glucose-Capillary: 143 mg/dL — ABNORMAL HIGH (ref 70–99)
Glucose-Capillary: 146 mg/dL — ABNORMAL HIGH (ref 70–99)

## 2018-10-18 SURGERY — REPAIR, HERNIA, INGUINAL, ADULT
Anesthesia: General | Site: Groin

## 2018-10-18 MED ORDER — HYDROMORPHONE HCL 1 MG/ML IJ SOLN
INTRAMUSCULAR | Status: AC
  Filled 2018-10-18: qty 0.5

## 2018-10-18 MED ORDER — DEXAMETHASONE SODIUM PHOSPHATE 10 MG/ML IJ SOLN
INTRAMUSCULAR | Status: AC
  Filled 2018-10-18: qty 1

## 2018-10-18 MED ORDER — ONDANSETRON HCL 4 MG/2ML IJ SOLN: 4.0000 mg | Freq: Once | INTRAMUSCULAR | Status: DC | PRN

## 2018-10-18 MED ORDER — BUPIVACAINE-EPINEPHRINE 0.25% -1:200000 IJ SOLN
INTRAMUSCULAR | Status: DC | PRN
  Administered 2018-10-18: 13 mL

## 2018-10-18 MED ORDER — KETAMINE HCL 100 MG/ML IJ SOLN
INTRAMUSCULAR | Status: AC
  Filled 2018-10-18: qty 1

## 2018-10-18 MED ORDER — OXYCODONE HCL 5 MG PO TABS: 5.0000 mg | ORAL_TABLET | Freq: Once | ORAL | Status: DC | PRN

## 2018-10-18 MED ORDER — KETAMINE HCL 100 MG/ML IJ SOLN
INTRAMUSCULAR | Status: DC | PRN
  Administered 2018-10-18: 20 mg via INTRAVENOUS

## 2018-10-18 MED ORDER — MIDAZOLAM HCL 2 MG/2ML IJ SOLN
INTRAMUSCULAR | Status: AC
  Filled 2018-10-18: qty 2

## 2018-10-18 MED ORDER — MIDAZOLAM HCL 2 MG/2ML IJ SOLN: 1.0000 mg | INTRAMUSCULAR | Status: DC | PRN

## 2018-10-18 MED ORDER — OXYCODONE HCL 5 MG PO TABS: 5.0000 mg | ORAL_TABLET | Freq: Four times a day (QID) | ORAL | 0 refills | Status: DC | PRN

## 2018-10-18 MED ORDER — ONDANSETRON HCL 4 MG/2ML IJ SOLN
INTRAMUSCULAR | Status: DC | PRN
  Administered 2018-10-18: 4 mg via INTRAVENOUS

## 2018-10-18 MED ORDER — MEPERIDINE HCL 25 MG/ML IJ SOLN: 6.2500 mg | INTRAMUSCULAR | Status: DC | PRN

## 2018-10-18 MED ORDER — LIDOCAINE 2% (20 MG/ML) 5 ML SYRINGE
INTRAMUSCULAR | Status: AC
  Filled 2018-10-18: qty 5

## 2018-10-18 MED ORDER — PROPOFOL 10 MG/ML IV BOLUS
INTRAVENOUS | Status: DC | PRN
  Administered 2018-10-18 (×2): 30 mg via INTRAVENOUS
  Administered 2018-10-18: 200 mg via INTRAVENOUS
  Administered 2018-10-18: 30 mg via INTRAVENOUS
  Administered 2018-10-18: 50 mg via INTRAVENOUS

## 2018-10-18 MED ORDER — FENTANYL CITRATE (PF) 100 MCG/2ML IJ SOLN: 50.0000 ug | INTRAMUSCULAR | Status: DC | PRN

## 2018-10-18 MED ORDER — VANCOMYCIN HCL 1000 MG IV SOLR
INTRAVENOUS | Status: DC | PRN
  Administered 2018-10-18: 1000 mg via INTRAVENOUS

## 2018-10-18 MED ORDER — FENTANYL CITRATE (PF) 100 MCG/2ML IJ SOLN
INTRAMUSCULAR | Status: AC
  Filled 2018-10-18: qty 2

## 2018-10-18 MED ORDER — VANCOMYCIN HCL IN DEXTROSE 1-5 GM/200ML-% IV SOLN
1000.0000 mg | INTRAVENOUS | Status: AC
  Administered 2018-10-18: 1000 mg via INTRAVENOUS

## 2018-10-18 MED ORDER — KETOROLAC TROMETHAMINE 30 MG/ML IJ SOLN: 30.0000 mg | Freq: Once | INTRAMUSCULAR | Status: DC | PRN

## 2018-10-18 MED ORDER — SCOPOLAMINE 1 MG/3DAYS TD PT72: 1.0000 | MEDICATED_PATCH | Freq: Once | TRANSDERMAL | Status: DC | PRN

## 2018-10-18 MED ORDER — DEXAMETHASONE SODIUM PHOSPHATE 4 MG/ML IJ SOLN
INTRAMUSCULAR | Status: DC | PRN
  Administered 2018-10-18: 10 mg via INTRAVENOUS

## 2018-10-18 MED ORDER — ONDANSETRON HCL 4 MG/2ML IJ SOLN
INTRAMUSCULAR | Status: AC
  Filled 2018-10-18: qty 2

## 2018-10-18 MED ORDER — LIDOCAINE 2% (20 MG/ML) 5 ML SYRINGE
INTRAMUSCULAR | Status: DC | PRN
  Administered 2018-10-18: 100 mg via INTRAVENOUS

## 2018-10-18 MED ORDER — PROPOFOL 10 MG/ML IV BOLUS
INTRAVENOUS | Status: AC
  Filled 2018-10-18: qty 20

## 2018-10-18 MED ORDER — KETOROLAC TROMETHAMINE 30 MG/ML IJ SOLN
INTRAMUSCULAR | Status: DC | PRN
  Administered 2018-10-18: 30 mg via INTRAVENOUS

## 2018-10-18 MED ORDER — BUPIVACAINE-EPINEPHRINE (PF) 0.25% -1:200000 IJ SOLN
INTRAMUSCULAR | Status: AC
  Filled 2018-10-18: qty 90

## 2018-10-18 MED ORDER — OXYCODONE HCL 5 MG/5ML PO SOLN: 5.0000 mg | Freq: Once | ORAL | Status: DC | PRN

## 2018-10-18 MED ORDER — VANCOMYCIN HCL IN DEXTROSE 1-5 GM/200ML-% IV SOLN
INTRAVENOUS | Status: AC
  Filled 2018-10-18: qty 200

## 2018-10-18 MED ORDER — LACTATED RINGERS IV SOLN
INTRAVENOUS | Status: DC
  Administered 2018-10-18 (×2): via INTRAVENOUS

## 2018-10-18 MED ORDER — ROCURONIUM BROMIDE 10 MG/ML (PF) SYRINGE
PREFILLED_SYRINGE | INTRAVENOUS | Status: AC
  Filled 2018-10-18: qty 10

## 2018-10-18 MED ORDER — HYDROMORPHONE HCL 1 MG/ML IJ SOLN
0.2500 mg | INTRAMUSCULAR | Status: DC | PRN
  Administered 2018-10-18 (×2): 0.25 mg via INTRAVENOUS

## 2018-10-18 MED ORDER — FENTANYL CITRATE (PF) 100 MCG/2ML IJ SOLN
INTRAMUSCULAR | Status: DC | PRN
  Administered 2018-10-18 (×8): 25 ug via INTRAVENOUS

## 2018-10-18 MED ORDER — CHLORHEXIDINE GLUCONATE CLOTH 2 % EX PADS: 6.0000 | MEDICATED_PAD | Freq: Once | CUTANEOUS | Status: DC

## 2018-10-18 SURGICAL SUPPLY — 79 items
APPLICATOR COTTON TIP 6 STRL (MISCELLANEOUS) IMPLANT
APPLICATOR COTTON TIP 6IN STRL (MISCELLANEOUS)
BENZOIN TINCTURE PRP APPL 2/3 (GAUZE/BANDAGES/DRESSINGS) ×4 IMPLANT
BLADE CLIPPER SURG (BLADE) ×4 IMPLANT
BLADE HEX COATED 2.75 (ELECTRODE) ×4 IMPLANT
BLADE SURG 15 STRL LF DISP TIS (BLADE) ×4 IMPLANT
BLADE SURG 15 STRL SS (BLADE) ×4
CANISTER SUCT 1200ML W/VALVE (MISCELLANEOUS) IMPLANT
CHLORAPREP W/TINT 26 (MISCELLANEOUS) ×4 IMPLANT
CLOSURE WOUND 1/2 X4 (GAUZE/BANDAGES/DRESSINGS) ×1
COVER BACK TABLE REUSABLE LG (DRAPES) ×4 IMPLANT
COVER MAYO STAND REUSABLE (DRAPES) ×4 IMPLANT
COVER WAND RF STERILE (DRAPES) IMPLANT
DECANTER SPIKE VIAL GLASS SM (MISCELLANEOUS) ×2 IMPLANT
DRAIN PENROSE 1/2X12 LTX STRL (WOUND CARE) ×4 IMPLANT
DRAPE LAPAROSCOPIC ABDOMINAL (DRAPES) ×2 IMPLANT
DRAPE LAPAROTOMY T 102X78X121 (DRAPES) ×2 IMPLANT
DRAPE LAPAROTOMY TRNSV 102X78 (DRAPE) ×2 IMPLANT
DRAPE UTILITY XL STRL (DRAPES) ×4 IMPLANT
DRSG TEGADERM 2-3/8X2-3/4 SM (GAUZE/BANDAGES/DRESSINGS) ×2 IMPLANT
DRSG TEGADERM 4X4.75 (GAUZE/BANDAGES/DRESSINGS) ×6 IMPLANT
ELECT REM PT RETURN 9FT ADLT (ELECTROSURGICAL) ×4
ELECTRODE REM PT RTRN 9FT ADLT (ELECTROSURGICAL) ×2 IMPLANT
GAUZE SPONGE 4X4 12PLY STRL LF (GAUZE/BANDAGES/DRESSINGS) ×4 IMPLANT
GLOVE BIO SURGEON STRL SZ 6.5 (GLOVE) ×1 IMPLANT
GLOVE BIO SURGEON STRL SZ7 (GLOVE) ×4 IMPLANT
GLOVE BIO SURGEONS STRL SZ 6.5 (GLOVE) ×1
GLOVE BIOGEL PI IND STRL 6.5 (GLOVE) IMPLANT
GLOVE BIOGEL PI IND STRL 7.5 (GLOVE) ×2 IMPLANT
GLOVE BIOGEL PI INDICATOR 6.5 (GLOVE) ×2
GLOVE BIOGEL PI INDICATOR 7.5 (GLOVE) ×2
GLOVE EXAM NITRILE MD LF STRL (GLOVE) ×2 IMPLANT
GOWN STRL REUS W/ TWL LRG LVL3 (GOWN DISPOSABLE) ×4 IMPLANT
GOWN STRL REUS W/TWL LRG LVL3 (GOWN DISPOSABLE) ×4
MESH PARIETEX PROGRIP LEFT (Mesh General) ×2 IMPLANT
MESH VENTRALEX ST 1-7/10 CRC S (Mesh General) ×2 IMPLANT
NDL HYPO 25X1 1.5 SAFETY (NEEDLE) ×2 IMPLANT
NDL SAFETY ECLIPSE 18X1.5 (NEEDLE) IMPLANT
NEEDLE HYPO 18GX1.5 SHARP (NEEDLE)
NEEDLE HYPO 25X1 1.5 SAFETY (NEEDLE) ×4 IMPLANT
NS IRRIG 1000ML POUR BTL (IV SOLUTION) ×2 IMPLANT
PACK BASIN DAY SURGERY FS (CUSTOM PROCEDURE TRAY) ×4 IMPLANT
PENCIL BUTTON HOLSTER BLD 10FT (ELECTRODE) ×4 IMPLANT
SLEEVE SCD COMPRESS KNEE MED (MISCELLANEOUS) ×4 IMPLANT
SPONGE GAUZE 2X2 8PLY STER LF (GAUZE/BANDAGES/DRESSINGS)
SPONGE GAUZE 2X2 8PLY STRL LF (GAUZE/BANDAGES/DRESSINGS) ×2 IMPLANT
SPONGE INTESTINAL PEANUT (DISPOSABLE) ×4 IMPLANT
SPONGE LAP 18X18 RF (DISPOSABLE) IMPLANT
STAPLER VISISTAT 35W (STAPLE) IMPLANT
STRIP CLOSURE SKIN 1/2X4 (GAUZE/BANDAGES/DRESSINGS) ×3 IMPLANT
SUT MNCRL AB 4-0 PS2 18 (SUTURE) ×4 IMPLANT
SUT MON AB 4-0 PC3 18 (SUTURE) ×4 IMPLANT
SUT NOVA 0 T19/GS 22DT (SUTURE) ×4 IMPLANT
SUT NOVA NAB DX-16 0-1 5-0 T12 (SUTURE) IMPLANT
SUT NOVA NAB GS-21 1 T12 (SUTURE) IMPLANT
SUT PDS AB 0 CT 36 (SUTURE) IMPLANT
SUT PROLENE 0 CT 2 (SUTURE) IMPLANT
SUT SILK 2 0 SH (SUTURE) IMPLANT
SUT SILK 3 0 SH 30 (SUTURE) IMPLANT
SUT SILK 3 0 TIES 17X18 (SUTURE)
SUT SILK 3-0 18XBRD TIE BLK (SUTURE) IMPLANT
SUT VIC AB 0 CT1 27 (SUTURE) ×2
SUT VIC AB 0 CT1 27XBRD ANBCTR (SUTURE) ×2 IMPLANT
SUT VIC AB 0 SH 27 (SUTURE) IMPLANT
SUT VIC AB 2-0 CT1 27 (SUTURE)
SUT VIC AB 2-0 CT1 TAPERPNT 27 (SUTURE) IMPLANT
SUT VIC AB 2-0 SH 27 (SUTURE) ×2
SUT VIC AB 2-0 SH 27XBRD (SUTURE) ×2 IMPLANT
SUT VIC AB 3-0 SH 27 (SUTURE) ×4
SUT VIC AB 3-0 SH 27X BRD (SUTURE) ×2 IMPLANT
SUT VIC AB 4-0 BRD 54 (SUTURE) IMPLANT
SUT VIC AB 4-0 SH 18 (SUTURE) IMPLANT
SUT VICRYL 4-0 PS2 18IN ABS (SUTURE) IMPLANT
SYR BULB 3OZ (MISCELLANEOUS) IMPLANT
SYR CONTROL 10ML LL (SYRINGE) ×4 IMPLANT
TOWEL GREEN STERILE FF (TOWEL DISPOSABLE) ×4 IMPLANT
TUBE CONNECTING 20'X1/4 (TUBING)
TUBE CONNECTING 20X1/4 (TUBING) IMPLANT
YANKAUER SUCT BULB TIP NO VENT (SUCTIONS) IMPLANT

## 2018-10-18 NOTE — Anesthesia Postprocedure Evaluation (Signed)
Anesthesia Post Note  Patient: Spencer Walton  Procedure(s) Performed: LEFT INGUINAL HERNIA REPAIR WITH MESH (Left Groin) UMBILICAL HERNIA REPAIR WITH MESH (N/A Abdomen)     Patient location during evaluation: PACU Anesthesia Type: General Level of consciousness: awake and alert Pain management: pain level controlled Vital Signs Assessment: post-procedure vital signs reviewed and stable Respiratory status: spontaneous breathing, nonlabored ventilation, respiratory function stable and patient connected to nasal cannula oxygen Cardiovascular status: blood pressure returned to baseline and stable Postop Assessment: no apparent nausea or vomiting Anesthetic complications: no    Last Vitals:  Vitals:   10/18/18 0945 10/18/18 1000  BP: 140/90 128/89  Pulse: 83 80  Resp: 16 16  Temp:    SpO2: 100% 99%    Last Pain:  Vitals:   10/18/18 1000  TempSrc:   PainSc: 4                  Barnet Glasgow

## 2018-10-18 NOTE — Anesthesia Procedure Notes (Signed)
Procedure Name: LMA Insertion Date/Time: 10/18/2018 7:41 AM Performed by: Marrianne Mood, CRNA Pre-anesthesia Checklist: Patient identified, Emergency Drugs available, Suction available and Patient being monitored Patient Re-evaluated:Patient Re-evaluated prior to induction Oxygen Delivery Method: Circle system utilized Preoxygenation: Pre-oxygenation with 100% oxygen Induction Type: IV induction Ventilation: Mask ventilation without difficulty LMA: LMA inserted LMA Size: 5.0 Number of attempts: 1 Airway Equipment and Method: Bite block Placement Confirmation: positive ETCO2 Tube secured with: Tape Dental Injury: Teeth and Oropharynx as per pre-operative assessment

## 2018-10-18 NOTE — Anesthesia Preprocedure Evaluation (Addendum)
Anesthesia Evaluation  Patient identified by MRN, date of birth, ID band Patient awake    Reviewed: Allergy & Precautions, NPO status , Patient's Chart, lab work & pertinent test results  Airway Mallampati: II  TM Distance: >3 FB Neck ROM: Full    Dental no notable dental hx. (+) Poor Dentition, Edentulous Upper,    Pulmonary neg pulmonary ROS,    Pulmonary exam normal breath sounds clear to auscultation       Cardiovascular hypertension, negative cardio ROS Normal cardiovascular exam Rhythm:Regular Rate:Normal     Neuro/Psych negative neurological ROS     GI/Hepatic   Endo/Other  diabetes  Renal/GU      Musculoskeletal   Abdominal   Peds  Hematology negative hematology ROS (+)   Anesthesia Other Findings   Reproductive/Obstetrics                            Anesthesia Physical Anesthesia Plan  ASA: III  Anesthesia Plan: General   Post-op Pain Management:    Induction: Intravenous  PONV Risk Score and Plan: 3 and Treatment may vary due to age or medical condition, Ondansetron and Dexamethasone  Airway Management Planned: LMA  Additional Equipment:   Intra-op Plan:   Post-operative Plan: Extubation in OR  Informed Consent:     Dental advisory given  Plan Discussed with:   Anesthesia Plan Comments:        Anesthesia Quick Evaluation

## 2018-10-18 NOTE — Transfer of Care (Signed)
Immediate Anesthesia Transfer of Care Note  Patient: Spencer Walton  Procedure(s) Performed: LEFT INGUINAL HERNIA REPAIR WITH MESH (Left Groin) UMBILICAL HERNIA REPAIR WITH MESH (N/A Abdomen)  Patient Location: PACU  Anesthesia Type:General  Level of Consciousness: awake and patient cooperative  Airway & Oxygen Therapy: Patient Spontanous Breathing and Patient connected to face mask oxygen  Post-op Assessment: Report given to RN and Post -op Vital signs reviewed and stable  Post vital signs: Reviewed and stable  Last Vitals:  Vitals Value Taken Time  BP 134/59 10/18/18 0905  Temp    Pulse 88 10/18/18 0906  Resp 14 10/18/18 0906  SpO2 100 % 10/18/18 0906  Vitals shown include unvalidated device data.  Last Pain:  Vitals:   10/18/18 0644  TempSrc: Oral  PainSc: 0-No pain         Complications: No apparent anesthesia complications

## 2018-10-18 NOTE — H&P (Signed)
History of Present Illness  The patient is a 63 year old male who presents with an inguinal hernia. Referred by [name] for left inguinal hernia  This is a 63 year old male who is status post right inguinal hernia repair many years ago who presents with several years of slowly worsening left lower abdominal/left groin pain. He underwent a CT scan in July 2019 which showed a small left inguinal hernia containing some fat. On review of the CT scan, the patient also has a small umbilical hernia. The patient has had previous back surgery and continues to have a lot of pain coming from his back. He denies any obstructive symptoms. He presents now to discuss hernia repair.  CLINICAL DATA: Left-sided abdominal pain for the past year, worse during the past month.  EXAM: CT ABDOMEN AND PELVIS WITH CONTRAST  TECHNIQUE: Multidetector CT imaging of the abdomen and pelvis was performed using the standard protocol following bolus administration of intravenous contrast.  CONTRAST: 127mL OMNIPAQUE IOHEXOL 300 MG/ML SOLN  COMPARISON: None.  FINDINGS: Lower chest: Limited visualization of the lower thorax demonstrates minimal dependent subpleural ground-glass atelectasis. No focal airspace opacities. No pleural effusion.  Normal heart size. No pericardial effusion.  Hepatobiliary: Normal hepatic contour. No discrete hepatic lesions. Normal appearance of the gallbladder given degree of distention. No intra or extrahepatic biliary ductal dilatation. No ascites.  Pancreas: Normal appearance of the pancreas  Spleen: Normal appearance of the spleen  Adrenals/Urinary Tract: There is symmetric enhancement and excretion of the bilateral kidneys. No definite renal stones on this postcontrast examination. Punctate subcentimeter right-sided renal lesion is too small to adequately characterize of favored to represent a renal cyst. No discrete left-sided renal lesions.  There is a minimal amount of presumably age and body habitus related perinephric stranding. No urinary obstruction.  Normal appearance the bilateral adrenal glands. Normal appearance of the urinary bladder given degree distention.  Stomach/Bowel: Nonobstructive bowel gas pattern. Normal appearance of the terminal ileum and retrocecal appendix. No pneumoperitoneum, pneumatosis or portal venous gas.  Vascular/Lymphatic: Scattered atherosclerotic plaque within a normal caliber abdominal aorta, not resulting in a hemodynamically significant stenosis. The major branch vessels of the abdominal aorta appear widely patent on this non CTA examination.  No bulky retroperitoneal, mesenteric, pelvic or inguinal lymphadenopathy.  Reproductive: Dystrophic calcifications within a borderline enlarged prostate gland. No free fluid within the pelvic cul-de-sac.  Other: Small left-sided mesenteric fat containing indirect inguinal hernia (images 77 through 89, series 3).  Musculoskeletal: No acute or aggressive osseous abnormalities. Mild (approximately 25%) compression deformity involving the anterior aspect of the inferior endplate of the L1 vertebral body without associated fracture line or paraspinal hematoma. There is partial ankylosis of the L4-L5 intervertebral disc space. Stigmata of DISH within the thoracic spine. Moderate severe degenerative change the bilateral hips with joint space loss, subchondral sclerosis, osteophytosis and minimal amount of grossly symmetric bilateral axial migration. Note is made of a small right-sided os acetabuli.  IMPRESSION: 1. Small left-sided mesenteric fat containing inguinal hernia. Otherwise, no explanation for patient's left sided abdominal pain. 2. Aortic Atherosclerosis (ICD10-I70.0).   Electronically Signed By: Sandi Mariscal M.D. On: 11/26/2017 13:04   Past Surgical History  Open Inguinal Hernia Surgery Left. Spinal Surgery  Midback  Diagnostic Studies History  Colonoscopy never  Allergies  Penicillins Swelling. Allergies Reconciled  Medication History Losartan Potassium (50MG  Tablet, Oral) Active. metFORMIN HCl (500MG  Tablet, Oral) Active. Medications Reconciled  Social History  Alcohol use Moderate alcohol use. No caffeine use No drug use  Tobacco use Never smoker.  Family History  Alcohol Abuse Father. Arthritis Father, Mother. Colon Cancer Mother. Colon Polyps Mother, Sister. Hypertension Father, Mother.  Other Problems  Back Pain Diabetes Mellitus Hemorrhoids High blood pressure Inguinal Hernia Umbilical Hernia Repair Ventral Hernia Repair     Review of Systems  General Not Present- Appetite Loss, Chills, Fatigue, Fever, Night Sweats, Weight Gain and Weight Loss. Skin Present- Change in Wart/Mole and Non-Healing Wounds. Not Present- Dryness, Hives, Jaundice, New Lesions, Rash and Ulcer. HEENT Present- Earache, Hoarseness and Wears glasses/contact lenses. Not Present- Hearing Loss, Nose Bleed, Oral Ulcers, Ringing in the Ears, Seasonal Allergies, Sinus Pain, Sore Throat, Visual Disturbances and Yellow Eyes. Respiratory Present- Difficulty Breathing. Not Present- Bloody sputum, Chronic Cough, Snoring and Wheezing. Cardiovascular Present- Leg Cramps. Not Present- Chest Pain, Difficulty Breathing Lying Down, Palpitations, Rapid Heart Rate, Shortness of Breath and Swelling of Extremities. Gastrointestinal Present- Abdominal Pain, Bloating, Excessive gas, Indigestion and Nausea. Not Present- Bloody Stool, Change in Bowel Habits, Chronic diarrhea, Constipation, Difficulty Swallowing, Gets full quickly at meals, Hemorrhoids, Rectal Pain and Vomiting. Male Genitourinary Present- Frequency, Impotence, Urgency and Urine Leakage. Not Present- Blood in Urine, Change in Urinary Stream, Nocturia and Painful Urination. Musculoskeletal Present- Back Pain, Joint Pain,  Joint Stiffness and Muscle Pain. Not Present- Muscle Weakness and Swelling of Extremities. Neurological Present- Decreased Memory, Headaches, Numbness, Tingling, Trouble walking and Weakness. Not Present- Fainting, Seizures and Tremor. Psychiatric Present- Anxiety and Fearful. Not Present- Bipolar, Change in Sleep Pattern, Depression and Frequent crying. Endocrine Present- New Diabetes. Not Present- Cold Intolerance, Excessive Hunger, Hair Changes, Heat Intolerance and Hot flashes. Hematology Not Present- Blood Thinners, Easy Bruising, Excessive bleeding, Gland problems, HIV and Persistent Infections.  Vitals  Weight: 213.2 lb Height: 72in Body Surface Area: 2.19 m Body Mass Index: 28.91 kg/m  Temp.: 97.68F(Temporal)  Pulse: 106 (Regular)  P.OX: 98% (Room air) BP: 138/100 (Sitting, Left Arm, Standard)      Physical Exam  The physical exam findings are as follows: Note:WDWN in NAD Eyes: Pupils equal, round; sclera anicteric HENT: Oral mucosa moist; good dentition Neck: No masses palpated, no thyromegaly Lungs: CTA bilaterally; normal respiratory effort CV: Regular rate and rhythm; no murmurs; extremities well-perfused with no edema Abd: +bowel sounds, soft, non-tender, no palpable organomegaly; small reducible umbilical hernia with 1 cm defect GU: bilateral descended testes; no testicular masses. Small reducible left inguinal hernia. Healed right inguinal incision with no sign of hernia Skin: Warm, dry; no sign of jaundice Psychiatric - alert and oriented x 4; calm mood and affect    Assessment & Plan   INGUINAL HERNIA OF LEFT SIDE WITHOUT OBSTRUCTION OR GANGRENE (W23.76)   UMBILICAL HERNIA WITHOUT OBSTRUCTION OR GANGRENE (K42.9)  Current Plans Schedule for Surgery - Left inguinal hernia repair with mesh. Umbilical hernia repair with mesh. The surgical procedure has been discussed with the patient. Potential risks, benefits, alternative  treatments, and expected outcomes have been explained. All of the patient's questions at this time have been answered. The likelihood of reaching the patient's treatment goal is good. The patient understand the proposed surgical procedure and wishes to proceed.   Imogene Burn. Georgette Dover, MD, [facility] [phone]  10/18/2018 7:19 AM

## 2018-10-18 NOTE — Op Note (Signed)
Left inguinal hernia repair with mesh/ umbilical hernia repair with mesh Procedure Note  Indications: The patient presented with a history of a left, reducible inguinal hernia and a reducible umbilical hernia.    Pre-operative Diagnosis: left reducible inguinal hernia/ umbilical hernia Post-operative Diagnosis: same  Surgeon: Maia Petties   Assistants: none  Anesthesia: General LMA anesthesia  ASA Class: 2  Procedure Details  The patient was seen again in the Holding Room. The risks, benefits, complications, treatment options, and expected outcomes were discussed with the patient. The possibilities of reaction to medication, pulmonary aspiration, perforation of viscus, bleeding, recurrent infection, the need for additional procedures, and development of a complication requiring transfusion or further operation were discussed with the patient and/or family. The likelihood of success in repairing the hernia and returning the patient to their previous functional status is good.  There was concurrence with the proposed plan, and informed consent was obtained. The site of surgery was properly noted/marked. The patient was taken to the Operating Room, identified as Spencer Walton, and the procedure verified as left inguinal hernia repair and umbilical hernia repair. A Time Out was held and the above information confirmed.  The patient was placed in the supine position and underwent induction of anesthesia. The lower abdomen and groin was prepped with Chloraprep and draped in the standard fashion, and 0.25% Marcaine with epinephrine was used to anesthetize the skin over the mid-portion of the left inguinal canal. An oblique incision was made. Dissection was carried down through the subcutaneous tissue with cautery to the external oblique fascia.  We opened the external oblique fascia along the direction of its fibers to the external ring.  The spermatic cord was circumferentially dissected bluntly  and retracted with a Penrose drain.  The ilioinguinal nerve was identified and preserved.  The floor of the inguinal canal was inspected and was intact.  We skeletonized the spermatic cord and reduced a large indirect hernia sac.  The internal ring was tightened with 0 Vicryl.  We used a left Progrip mesh which was inserted and deployed across the floor of the inguinal canal. The mesh was tucked underneath the external oblique fascia laterally.  The flap of the mesh was closed around the spermatic cord to recreate the internal inguinal ring.  The mesh was secured to the pubic tubercle with 0 Vicryl.  The external oblique fascia was reapproximated with 2-0 Vicryl.  3-0 Vicryl was used to close the subcutaneous tissues and 4-0 Monocryl was used to close the skin in subcuticular fashion.    We then turned our attention to the umbilical hernia.  We made a transverse incision above the umbilicus.  Dissection was carried down to the hernia sac with cautery.  We dissected bluntly around the hernia sac down to the edge of the fascial defect.  We reduced the hernia sac back into the pre-peritoneal space.  The fascial defect measured 2 cm.  We cleared the fascia in all directions.  A small Ventralex mesh was inserted into the pre-peritoneal space and was deployed.  The mesh was secured with four trans-fascial sutures of 0 Novofil.  The fascial defect was closed with multiple interrupted figure-of-eight 0 Novofil sutures.  The base of the umbilicus was tacked down with 3-0 Vicryl.  3-0 Vicryl was used to close the subcutaneous tissues and 4-0 Monocryl was used to close the skin.  Steri-strips and clean dressing were applied to both incision.  The patient was extubated and brought to the recovery room in  stable condition.  All sponge, instrument, and needle counts were correct prior to closure and at the conclusion of the case.   Estimated Blood Loss: Minimal          Complications: None; patient tolerated the procedure  well.         Disposition: PACU - hemodynamically stable.         Condition: stable  Imogene Burn. Georgette Dover, MD, [facility] [phone]  10/18/2018 9:03 AM

## 2018-10-18 NOTE — Discharge Instructions (Signed)
[facility] _______[facility] ° °UMBILICAL OR INGUINAL HERNIA REPAIR: POST OP INSTRUCTIONS ° °Always review your discharge instruction sheet given to you by the facility where your surgery was performed. °IF YOU HAVE DISABILITY OR FAMILY LEAVE FORMS, YOU MUST BRING THEM TO THE OFFICE FOR PROCESSING.   °DO NOT GIVE THEM TO YOUR DOCTOR. ° °1. A  prescription for pain medication may be given to you upon discharge.  Take your pain medication as prescribed, if needed.  If narcotic pain medicine is not needed, then you may take acetaminophen (Tylenol) or ibuprofen (Advil) as needed. °2. Take your usually prescribed medications unless otherwise directed. °If you need a refill on your pain medication, please contact your pharmacy.  They will contact our office to request authorization. Prescriptions will not be filled after 5 pm or on week-ends. °3. You should follow a light diet the first 24 hours after arrival home, such as soup and crackers, etc.  Be sure to include lots of fluids daily.  Resume your normal diet the day after surgery. °4.Most patients will experience some swelling and bruising around the umbilicus or in the groin and scrotum.  Ice packs and reclining will help.  Swelling and bruising can take several days to resolve.  °6. It is common to experience some constipation if taking pain medication after surgery.  Increasing fluid intake and taking a stool softener (such as Colace) will usually help or prevent this problem from occurring.  A mild laxative (Milk of Magnesia or Miralax) should be taken according to package directions if there are no bowel movements after 48 hours. °7. Unless discharge instructions indicate otherwise, you may remove your bandages 24-48 hours after surgery, and you may shower at that time.  You may have steri-strips (small skin tapes) in place directly over the incision.  These strips should be left on the skin for 7-10 days.  If your surgeon used skin glue on the  incision, you may shower in 24 hours.  The glue will flake off over the next 2-3 weeks.  Any sutures or staples will be removed at the office during your follow-up visit. °8. ACTIVITIES:  You may resume regular (light) daily activities beginning the next day--such as daily self-care, walking, climbing stairs--gradually increasing activities as tolerated.  You may have sexual intercourse when it is comfortable.  Refrain from any heavy lifting or straining until approved by your doctor. ° °a.You may drive when you are no longer taking prescription pain medication, you can comfortably wear a seatbelt, and you can safely maneuver your car and apply brakes. °b.RETURN TO WORK:   °_____________________________________________ ° °9.You should see your doctor in the office for a follow-up appointment approximately 2-3 weeks after your surgery.  Make sure that you call for this appointment within a day or two after you arrive home to insure a convenient appointment time. °10.OTHER INSTRUCTIONS: _________________________ °   _____________________________________ ° °WHEN TO CALL YOUR DOCTOR: °1. Fever over 101.0 °2. Inability to urinate °3. Nausea and/or vomiting °4. Extreme swelling or bruising °5. Continued bleeding from incision. °6. Increased pain, redness, or drainage from the incision ° °The clinic staff is available to answer your questions during regular business hours.  Please don’t hesitate to call and ask to speak to one of the nurses for clinical concerns.  If you have a medical emergency, go to the nearest emergency room or call 911.  A surgeon from [facility] is always on call at the hospital ° ° °  [address], [address], [place], [place]  [address] ? ° [address] [address], [place], [place]   [address] °[phone] ? [phone] ? FAX [phone] °Web site: [email or web address] ° ° °Post Anesthesia Home Care Instructions ° °Activity: °Get plenty of rest for the remainder of the day. A  responsible individual must stay with you for 24 hours following the procedure.  °For the next 24 hours, DO NOT: °-Drive a car °-Operate machinery °-Drink alcoholic beverages °-Take any medication unless instructed by your physician °-Make any legal decisions or sign important papers. ° °Meals: °Start with liquid foods such as gelatin or soup. Progress to regular foods as tolerated. Avoid greasy, spicy, heavy foods. If nausea and/or vomiting occur, drink only clear liquids until the nausea and/or vomiting subsides. Call your physician if vomiting continues. ° °Special Instructions/Symptoms: °Your throat may feel dry or sore from the anesthesia or the breathing tube placed in your throat during surgery. If this causes discomfort, gargle with warm salt water. The discomfort should disappear within 24 hours. ° °If you had a scopolamine patch placed behind your ear for the management of post- operative nausea and/or vomiting: ° °1. The medication in the patch is effective for 72 hours, after which it should be removed.  Wrap patch in a tissue and discard in the trash. Wash hands thoroughly with soap and water. °2. You may remove the patch earlier than 72 hours if you experience unpleasant side effects which may include dry mouth, dizziness or visual disturbances. °3. Avoid touching the patch. Wash your hands with soap and water after contact with the patch. °  ° °

## 2018-10-19 ENCOUNTER — Encounter (HOSPITAL_BASED_OUTPATIENT_CLINIC_OR_DEPARTMENT_OTHER): Payer: Self-pay | Admitting: Surgery

## 2018-10-20 ENCOUNTER — Other Ambulatory Visit: Payer: Self-pay | Admitting: Surgery

## 2019-02-06 ENCOUNTER — Other Ambulatory Visit: Payer: Self-pay | Admitting: Internal Medicine

## 2019-03-29 ENCOUNTER — Ambulatory Visit: Payer: Medicaid Other | Admitting: Podiatry

## 2019-03-29 ENCOUNTER — Other Ambulatory Visit: Payer: Self-pay

## 2019-03-29 ENCOUNTER — Encounter: Payer: Self-pay | Admitting: Podiatry

## 2019-03-29 DIAGNOSIS — E119 Type 2 diabetes mellitus without complications: Secondary | ICD-10-CM | POA: Diagnosis not present

## 2019-03-29 DIAGNOSIS — M79675 Pain in left toe(s): Secondary | ICD-10-CM

## 2019-03-29 DIAGNOSIS — M79674 Pain in right toe(s): Secondary | ICD-10-CM

## 2019-03-29 DIAGNOSIS — B351 Tinea unguium: Secondary | ICD-10-CM

## 2019-03-29 NOTE — Progress Notes (Signed)
This patient presents the office for treatment of his long painful toenails both feet.  Patient states that he has back problems and he is unable to self treat his nails.  Patient states the nails are painful walking wearing his shoes.   Patient states he is also diabetic.  Patient states he has a fullness and occasional pain in his left forefoot.  Patient has no history of trauma or injury to his left forefoot.  He presents the office today for diabetic foot exam and nail care.  General Appearance  Alert, conversant and in no acute stress.  Vascular  Dorsalis pedis and posterior tibial  pulses are  Weakly  palpable  bilaterally.  Capillary return is within normal limits  bilaterally. Temperature is within normal limits  bilaterally.  Neurologic  Senn-Weinstein monofilament wire test within normal limits  bilaterally. Muscle power within normal limits bilaterally.  Nails Thick disfigured discolored nails with subungual debris  from hallux to fifth toes bilaterally.  Orthopedic  No limitations of motion  feet .  No crepitus or effusions noted.  No bony pathology or digital deformities noted. Hammer toe 2-5  B/L.  Hallux malleus  B/L  Skin  normotropic skin with no porokeratosis noted bilaterally.  No signs of infections or ulcers noted.    Onychomycosis  X 10.  Diabetes  IE.  Debride nails.  Discussed left forefoot pain with this patient.  Told him there were no skin changes nor swelling nor inflammation in his left forefoot.  Therefore I told him to make an appointment with one of the podiatric medical doctors in the practice.  RTC 3 months .   Gardiner Barefoot DPM

## 2019-05-20 ENCOUNTER — Other Ambulatory Visit: Payer: Self-pay | Admitting: Internal Medicine

## 2019-06-16 ENCOUNTER — Encounter: Payer: Self-pay | Admitting: Internal Medicine

## 2019-06-16 ENCOUNTER — Ambulatory Visit (INDEPENDENT_AMBULATORY_CARE_PROVIDER_SITE_OTHER): Payer: Medicaid Other | Admitting: Internal Medicine

## 2019-06-16 ENCOUNTER — Other Ambulatory Visit: Payer: Self-pay

## 2019-06-16 VITALS — BP 138/88 | HR 90 | Resp 12 | Ht 69.5 in | Wt 216.0 lb

## 2019-06-16 DIAGNOSIS — R809 Proteinuria, unspecified: Secondary | ICD-10-CM

## 2019-06-16 DIAGNOSIS — E118 Type 2 diabetes mellitus with unspecified complications: Secondary | ICD-10-CM | POA: Diagnosis not present

## 2019-06-16 DIAGNOSIS — Z79899 Other long term (current) drug therapy: Secondary | ICD-10-CM

## 2019-06-16 DIAGNOSIS — Z23 Encounter for immunization: Secondary | ICD-10-CM | POA: Diagnosis not present

## 2019-06-16 DIAGNOSIS — I10 Essential (primary) hypertension: Secondary | ICD-10-CM

## 2019-06-16 DIAGNOSIS — E1142 Type 2 diabetes mellitus with diabetic polyneuropathy: Secondary | ICD-10-CM

## 2019-06-16 LAB — GLUCOSE, POCT (MANUAL RESULT ENTRY): POC Glucose: 135 mg/dl — AB (ref 70–99)

## 2019-06-16 MED ORDER — METFORMIN HCL 500 MG PO TABS: 500.0000 mg | ORAL_TABLET | Freq: Two times a day (BID) | ORAL | 3 refills | Status: DC

## 2019-06-16 MED ORDER — GABAPENTIN 100 MG PO CAPS: ORAL_CAPSULE | ORAL | 11 refills | Status: DC

## 2019-06-16 MED ORDER — LOSARTAN POTASSIUM 50 MG PO TABS: 50.0000 mg | ORAL_TABLET | Freq: Every day | ORAL | 3 refills | Status: DC

## 2019-06-16 NOTE — Progress Notes (Signed)
    Subjective:    Patient ID: Spencer Spring., male   DOB: 1955-08-03, 64 y.o.   MRN: [id number]   HPI   1.  DM:  Not checking sugars.  He will not let others do it as he doesn't want to know if it's bad.  No polydipsia or polyuria--though his wife states he does.   Feet stay with numbness and tingling chronically.    Feels like thick material on bottom of feet. No eye check in years.   2.  Essential Hypertension:  Is physically active outside.  Does eat a fair amount of carbs.  3.  Hernias:  Umbilical and left inguinal repaired in past year.  Still with some discomfort in left inguinal area.     Current Meds  Medication Sig  . ibuprofen (ADVIL,MOTRIN) 200 MG tablet Take 200 mg by mouth every 6 (six) hours as needed for moderate pain.  Marland Kitchen losartan (COZAAR) 50 MG tablet Take 1 tablet by mouth once daily  . metFORMIN (GLUCOPHAGE) 500 MG tablet Take 1 tablet by mouth twice daily with food   Allergies  Allergen Reactions  . Cherry Extract Anaphylaxis, Hives, Swelling and Rash  . Fruit & Vegetable Daily [Nutritional Supplements] Anaphylaxis, Hives, Swelling and Rash    Fruit cocktail: Lips swell  . Other Anaphylaxis and Swelling    NO NUTS!!!!  . Peach [Prunus Persica] Anaphylaxis and Swelling  . Pecan Nut (Diagnostic) Shortness Of Breath and Swelling    Throat and tongue swelling  . Penicillins Anaphylaxis and Swelling    Has patient had a PCN reaction causing immediate rash, facial/tongue/throat swelling, SOB or lightheadedness with hypotension: Yes Has patient had a PCN reaction causing severe rash involving mucus membranes or skin necrosis: No Has patient had a PCN reaction that required hospitalization: No Has patient had a PCN reaction occurring within the last 10 years: No If all of the above answers are "NO", then may proceed with Cephalosporin use.   Marland Kitchen Pineapple Anaphylaxis, Hives and Swelling  . Lisinopril Cough     Review of Systems    Objective:   BP  138/88 (BP Location: Left Arm, Cuff Size: Normal)   Pulse 90   Resp 12   Ht 5' 9.5" (1.765 m)   Wt 216 lb (98 kg)   BMI 31.44 kg/m   Physical Exam  NAD HEENT:  PERRL, EOMI,  Neck:  Supple, No adenopathy Chest:  CTA CV:  RRR with normal S1 and S2, No S3, S4 or murmur.  Radial and DP pulses normal and equal Abd:  S, NT, No HSM or mass, + BS LE:  No edema Buttock:  Pedunculated skin tag-2 cm at gluteal fold of right buttock Feet:  Toenails with crumbling and discoloration.  Hammer toe Assessment & Plan   1.  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  A1C, CBC, CMP, urine microalbumin/crea, FLP.  Encouraged regular check of blood glucose until consistently well controlled. Continue Metformin Eye referral to Dr. Midge Aver.  Pneumococcal 23 valent.   Encouraged COVID vaccine when available to him  2.  Hypertension:  Controlled with Losartan.    3.  Diabetic peripheral neuropathy:  Start Gabapentin and titrate to 300 mg at bedtime for now.  4.  Skin tag on buttock:  To make an appt for removal, if so desired  5. Toenail onychomycosis and tinea pedis:  Will address possible treatments at follow up.

## 2019-06-17 LAB — COMPREHENSIVE METABOLIC PANEL
ALT: 22 IU/L (ref 0–44)
AST: 18 IU/L (ref 0–40)
Albumin/Globulin Ratio: 2.3 — ABNORMAL HIGH (ref 1.2–2.2)
Albumin: 4.8 g/dL (ref 3.8–4.8)
Alkaline Phosphatase: 63 IU/L (ref 39–117)
BUN/Creatinine Ratio: 12 (ref 10–24)
BUN: 10 mg/dL (ref 8–27)
Bilirubin Total: 0.6 mg/dL (ref 0.0–1.2)
CO2: 24 mmol/L (ref 20–29)
Calcium: 9.9 mg/dL (ref 8.6–10.2)
Chloride: 101 mmol/L (ref 96–106)
Creatinine, Ser: 0.85 mg/dL (ref 0.76–1.27)
GFR calc Af Amer: 108 mL/min/{1.73_m2} (ref >59)
GFR calc non Af Amer: 93 mL/min/{1.73_m2} (ref >59)
Globulin, Total: 2.1 g/dL (ref 1.5–4.5)
Glucose: 129 mg/dL — ABNORMAL HIGH (ref 65–99)
Potassium: 4.5 mmol/L (ref 3.5–5.2)
Sodium: 141 mmol/L (ref 134–144)
Total Protein: 6.9 g/dL (ref 6.0–8.5)

## 2019-06-17 LAB — CBC WITH DIFFERENTIAL/PLATELET
Basophils Absolute: 0.1 10*3/uL (ref 0.0–0.2)
Basos: 1 %
EOS (ABSOLUTE): 0.1 10*3/uL (ref 0.0–0.4)
Eos: 2 %
Hematocrit: 43.2 % (ref 37.5–51.0)
Hemoglobin: 14.9 g/dL (ref 13.0–17.7)
Immature Grans (Abs): 0 10*3/uL (ref 0.0–0.1)
Immature Granulocytes: 0 %
Lymphocytes Absolute: 1.7 10*3/uL (ref 0.7–3.1)
Lymphs: 25 %
MCH: 32 pg (ref 26.6–33.0)
MCHC: 34.5 g/dL (ref 31.5–35.7)
MCV: 93 fL (ref 79–97)
Monocytes Absolute: 0.4 10*3/uL (ref 0.1–0.9)
Monocytes: 6 %
Neutrophils Absolute: 4.6 10*3/uL (ref 1.4–7.0)
Neutrophils: 66 %
Platelets: 219 10*3/uL (ref 150–450)
RBC: 4.65 x10E6/uL (ref 4.14–5.80)
RDW: 12.2 % (ref 11.6–15.4)
WBC: 6.9 10*3/uL (ref 3.4–10.8)

## 2019-06-17 LAB — LIPID PANEL W/O CHOL/HDL RATIO
Cholesterol, Total: 212 mg/dL — ABNORMAL HIGH (ref 100–199)
HDL: 50 mg/dL (ref >39)
LDL Chol Calc (NIH): 139 mg/dL — ABNORMAL HIGH (ref 0–99)
Triglycerides: 131 mg/dL (ref 0–149)
VLDL Cholesterol Cal: 23 mg/dL (ref 5–40)

## 2019-06-17 LAB — MICROALBUMIN / CREATININE URINE RATIO
Creatinine, Urine: 52.4 mg/dL
Microalb/Creat Ratio: 71 mg/g creat — ABNORMAL HIGH (ref 0–29)
Microalbumin, Urine: 37.1 ug/mL

## 2019-06-17 LAB — HGB A1C W/O EAG: Hgb A1c MFr Bld: 6.6 % — ABNORMAL HIGH (ref 4.8–5.6)

## 2019-07-04 ENCOUNTER — Other Ambulatory Visit: Payer: Self-pay

## 2019-07-04 ENCOUNTER — Encounter: Payer: Self-pay | Admitting: Podiatry

## 2019-07-04 ENCOUNTER — Ambulatory Visit: Payer: Medicaid Other | Admitting: Podiatry

## 2019-07-04 VITALS — Temp 97.1°F

## 2019-07-04 DIAGNOSIS — E1142 Type 2 diabetes mellitus with diabetic polyneuropathy: Secondary | ICD-10-CM

## 2019-07-04 DIAGNOSIS — M79675 Pain in left toe(s): Secondary | ICD-10-CM | POA: Diagnosis not present

## 2019-07-04 DIAGNOSIS — M79674 Pain in right toe(s): Secondary | ICD-10-CM

## 2019-07-04 DIAGNOSIS — B351 Tinea unguium: Secondary | ICD-10-CM

## 2019-07-04 DIAGNOSIS — E1140 Type 2 diabetes mellitus with diabetic neuropathy, unspecified: Secondary | ICD-10-CM | POA: Insufficient documentation

## 2019-07-04 MED ORDER — NONFORMULARY OR COMPOUNDED ITEM: 3 refills | Status: DC

## 2019-07-04 NOTE — Progress Notes (Signed)
This patient presents the office for treatment of his long painful toenails both feet.  Patient states that he has back problems and he is unable to self treat his nails.  Patient states the nails are painful walking wearing his shoes.   Patient states he is also diabetic and experiences pain and tingling at night during sleep.  He presents the office today for diabetic foot exam and nail care.  General Appearance  Alert, conversant and in no acute stress.  Vascular  Dorsalis pedis and posterior tibial  pulses are  weakly  palpable  bilaterally.  Capillary return is within normal limits  bilaterally. Temperature is within normal limits  bilaterally.  Neurologic  Senn-Weinstein monofilament wire test within normal limits  bilaterally. Muscle power within normal limits bilaterally.  Nails Thick disfigured discolored nails with subungual debris  from hallux to fifth toes bilaterally.  Orthopedic  No limitations of motion  feet .  No crepitus or effusions noted.  No bony pathology or digital deformities noted. Hammer toe 2-5  B/L.  Hallux malleus  B/L  Skin  normotropic skin with no porokeratosis noted bilaterally.  No signs of infections or ulcers noted.    Onychomycosis  X 10.  Diabetes  ROV.  Debride nails.  Discussed neuropathy with this patient and ordered diabetic neuropathy topical from  [place].  RTC 3 months.    Gardiner Barefoot DPM

## 2019-07-26 ENCOUNTER — Other Ambulatory Visit: Payer: Self-pay | Admitting: Internal Medicine

## 2019-07-26 MED ORDER — SIMVASTATIN 40 MG PO TABS: ORAL_TABLET | ORAL | 11 refills | Status: DC

## 2019-09-12 ENCOUNTER — Other Ambulatory Visit: Payer: Self-pay

## 2019-09-12 ENCOUNTER — Other Ambulatory Visit (INDEPENDENT_AMBULATORY_CARE_PROVIDER_SITE_OTHER): Payer: Medicaid Other

## 2019-09-12 DIAGNOSIS — E7800 Pure hypercholesterolemia, unspecified: Secondary | ICD-10-CM | POA: Diagnosis not present

## 2019-09-13 ENCOUNTER — Encounter: Payer: Self-pay | Admitting: Internal Medicine

## 2019-09-13 ENCOUNTER — Ambulatory Visit: Payer: Medicaid Other | Admitting: Internal Medicine

## 2019-09-13 VITALS — BP 160/82 | HR 88 | Resp 12 | Ht 69.5 in | Wt 215.0 lb

## 2019-09-13 DIAGNOSIS — L918 Other hypertrophic disorders of the skin: Secondary | ICD-10-CM

## 2019-09-13 DIAGNOSIS — E118 Type 2 diabetes mellitus with unspecified complications: Secondary | ICD-10-CM

## 2019-09-13 DIAGNOSIS — I10 Essential (primary) hypertension: Secondary | ICD-10-CM

## 2019-09-13 DIAGNOSIS — E1142 Type 2 diabetes mellitus with diabetic polyneuropathy: Secondary | ICD-10-CM

## 2019-09-13 DIAGNOSIS — B351 Tinea unguium: Secondary | ICD-10-CM | POA: Insufficient documentation

## 2019-09-13 DIAGNOSIS — B353 Tinea pedis: Secondary | ICD-10-CM

## 2019-09-13 HISTORY — DX: Tinea unguium: B35.1

## 2019-09-13 LAB — LIPID PANEL W/O CHOL/HDL RATIO
Cholesterol, Total: 201 mg/dL — ABNORMAL HIGH (ref 100–199)
HDL: 55 mg/dL (ref >39)
LDL Chol Calc (NIH): 129 mg/dL — ABNORMAL HIGH (ref 0–99)
Triglycerides: 92 mg/dL (ref 0–149)
VLDL Cholesterol Cal: 17 mg/dL (ref 5–40)

## 2019-09-13 MED ORDER — TERBINAFINE HCL 250 MG PO TABS: ORAL_TABLET | ORAL | 0 refills | Status: DC

## 2019-09-13 MED ORDER — TEA TREE OIL OIL: TOPICAL_OIL | 0 refills | Status: DC

## 2019-09-13 MED ORDER — LOSARTAN POTASSIUM 100 MG PO TABS: 50.0000 mg | ORAL_TABLET | Freq: Every day | ORAL | 11 refills | Status: DC

## 2019-09-13 MED ORDER — ATORVASTATIN CALCIUM 40 MG PO TABS: 40.0000 mg | ORAL_TABLET | Freq: Every day | ORAL | 11 refills | Status: DC

## 2019-09-13 NOTE — Progress Notes (Signed)
Subjective:    Patient ID: Spencer Podgorny., male   DOB: [birth year], 64 y.o.   MRN: [id number]   HPI   1.  DM:  A1C was 6.6% back in February.  Sugars generally around 130s.  Highest has been 145.    2.  Hypertension:  BPs often high, sometimes okay.  Taking Losartan 50 mg daily.    3.  Hyperlipidemia:  Very little change with use of Simvastatin.  States he took every day for 4 weeks.    Lipid Panel     Component Value Date/Time   CHOL 201 (H) 09/12/2019 0926   TRIG 92 09/12/2019 0926   HDL 55 09/12/2019 0926   LDLCALC 129 (H) 09/12/2019 0926   LABVLDL 17 09/12/2019 0926      4.  Feet:  Taking gabapentin 200 mg at bedtime--helps with numbness/burning in feet at night. Could not tolerate more than 200 mg of med.  No problems during the day.  Able to sleep.  Does not check feet regularly.  Goes barefoot all the time.  Toenails thickened and crumbling.    5.  Skin tag on right buttock:  Would like removed.  Went over risks and benefits.    Current Meds  Medication Sig  . gabapentin (NEURONTIN) 100 MG capsule 1 cap by mouth at bedtime and increase by 1 cap every 3 days until taking 3 caps at bedtime.  Marland Kitchen ibuprofen (ADVIL,MOTRIN) 200 MG tablet Take 200 mg by mouth every 6 (six) hours as needed for moderate pain.  Marland Kitchen losartan (COZAAR) 50 MG tablet Take 1 tablet (50 mg total) by mouth daily.  . metFORMIN (GLUCOPHAGE) 500 MG tablet Take 1 tablet (500 mg total) by mouth 2 (two) times daily with a meal.  . simvastatin (ZOCOR) 40 MG tablet 1 tab by mouth daily with evening meal   Allergies  Allergen Reactions  . Cherry Extract Anaphylaxis, Hives, Swelling and Rash  . Fruit & Vegetable Daily [Nutritional Supplements] Anaphylaxis, Hives, Swelling and Rash    Fruit cocktail: Lips swell  . Other Anaphylaxis and Swelling    NO NUTS!!!!  . Peach [Prunus Persica] Anaphylaxis and Swelling  . Pecan Nut (Diagnostic) Shortness Of Breath and Swelling    Throat and tongue swelling  .  Penicillins Anaphylaxis and Swelling    Has patient had a PCN reaction causing immediate rash, facial/tongue/throat swelling, SOB or lightheadedness with hypotension: Yes Has patient had a PCN reaction causing severe rash involving mucus membranes or skin necrosis: No Has patient had a PCN reaction that required hospitalization: No Has patient had a PCN reaction occurring within the last 10 years: No If all of the above answers are "NO", then may proceed with Cephalosporin use.   Marland Kitchen Pineapple Anaphylaxis, Hives and Swelling  . Lisinopril Cough     Review of Systems    Objective:   BP (!) 160/82 (BP Location: Left Arm, Patient Position: Sitting, Cuff Size: Large)   Pulse 88   Resp 12   Ht 5' 9.5" (1.765 m)   Wt 215 lb (97.5 kg)   BMI 31.29 kg/m   Physical Exam  NAD Lungs:  CTA CV:  RRR without murmur or rub.  Radial pulses normal and equal LE:  No edema Feet with thickened and fissured callus around edges of heel and ball of foot.  Toenails with distal thickening and crumbling/discoloration.  Plantar foot with dirty flaking areas almost covering entire plantar area.  Procedure note:  Excision of large  skin tag Informed consent obtained--read consent to patient.    Right buttock area of concern cleaned in sterile fashion.  2% lidocaine 2 ml injected to SQ area at base of skin tag.  Sharp excision of skin tag performed with iris scissors.  Good hemostasis obtained with silver nitrate sticks.  Pressure bandage applied. Patient tolerated well without complication.   Assessment & Plan  1.  DM:  Has been controlled.  A1C at 12 week follow up  2.  Hypertension:  Uncontrolled.  Increase Losartan to 100 mg daily.  3.  Hyperlipidemia:  Not at goal.  Switch to Atorvastatin 40 mg daily with repeat FLP at 6 week follow up.  4.  Diabetic peripheral neuropathy:  Controlled with Gabapentin at bedtime only.  5.  Toenail onychomycosis and tinea pedis:  Terbinafine 250 mg daily for 84  days. and topical skin care with Tea Tree oil.  Shoe treatment and shower floor cleanliness discussed.  6.  COVID 19 vaccine:  Discussed at length.  He is not interested.  7.  Right buttock benign pendunculated skin tag removal.  Tolerated well.  To call for signs of infection.

## 2019-09-14 MED ORDER — LOSARTAN POTASSIUM 100 MG PO TABS: 100.0000 mg | ORAL_TABLET | Freq: Every day | ORAL | 11 refills | Status: DC

## 2019-10-04 ENCOUNTER — Ambulatory Visit: Payer: Medicaid Other | Admitting: Podiatry

## 2019-10-27 ENCOUNTER — Encounter: Payer: Self-pay | Admitting: Internal Medicine

## 2019-10-27 ENCOUNTER — Ambulatory Visit (INDEPENDENT_AMBULATORY_CARE_PROVIDER_SITE_OTHER): Payer: Medicaid Other | Admitting: Internal Medicine

## 2019-10-27 ENCOUNTER — Other Ambulatory Visit: Payer: Self-pay

## 2019-10-27 VITALS — BP 170/100 | HR 82 | Resp 12 | Ht 69.5 in | Wt 214.0 lb

## 2019-10-27 DIAGNOSIS — I10 Essential (primary) hypertension: Secondary | ICD-10-CM

## 2019-10-27 DIAGNOSIS — B353 Tinea pedis: Secondary | ICD-10-CM

## 2019-10-27 DIAGNOSIS — B351 Tinea unguium: Secondary | ICD-10-CM

## 2019-10-27 DIAGNOSIS — Z79899 Other long term (current) drug therapy: Secondary | ICD-10-CM

## 2019-10-27 DIAGNOSIS — E782 Mixed hyperlipidemia: Secondary | ICD-10-CM

## 2019-10-27 MED ORDER — HYDROCHLOROTHIAZIDE 25 MG PO TABS: ORAL_TABLET | ORAL | 11 refills | Status: DC

## 2019-10-27 MED ORDER — ATORVASTATIN CALCIUM 40 MG PO TABS: ORAL_TABLET | ORAL | 11 refills | Status: DC

## 2019-10-27 NOTE — Progress Notes (Signed)
Subjective:    Patient ID: Spencer Walton., male   DOB: 1955/09/03, 64 y.o.   MRN: [id number]   HPI  1.  Toenail onychomycosis:  Getting toenails clipped by podiatry.  Missed his last visit.   6 weeks out from start of treatment now with Terbinafine.  Not spraying shoes, cleaning shower floor or applying Tea Tree oil/Gold Bond Foot cream.  2.  Hypertension:  States taking Losartan regularly.  Then states ran out of medication.  Later, he did not miss a pill as he filled before out.  Called pharmacy and he has filled on both 5/13 and 6/9.  3.  Hyperlipidemia:  He has not yet received phone call to increase Atorvastatin to 60 mg .    4.  COVID vaccine:  He and wife are against getting.  Discussed at length   Current Meds  Medication Sig  . atorvastatin (LIPITOR) 40 MG tablet Take 1 tablet (40 mg total) by mouth daily.  Marland Kitchen gabapentin (NEURONTIN) 100 MG capsule 1 cap by mouth at bedtime and increase by 1 cap every 3 days until taking 3 caps at bedtime.  Marland Kitchen ibuprofen (ADVIL,MOTRIN) 200 MG tablet Take 200 mg by mouth every 6 (six) hours as needed for moderate pain.  Marland Kitchen losartan (COZAAR) 100 MG tablet Take 1 tablet (100 mg total) by mouth daily.  . metFORMIN (GLUCOPHAGE) 500 MG tablet Take 1 tablet (500 mg total) by mouth 2 (two) times daily with a meal.  . terbinafine (LAMISIL) 250 MG tablet 1 tab by mouth daily for 84 days.   Allergies  Allergen Reactions  . Cherry Extract Anaphylaxis, Hives, Swelling and Rash  . Fruit & Vegetable Daily [Nutritional Supplements] Anaphylaxis, Hives, Swelling and Rash    Fruit cocktail: Lips swell  . Other Anaphylaxis and Swelling    NO NUTS!!!!  . Peach [Prunus Persica] Anaphylaxis and Swelling  . Pecan Nut (Diagnostic) Shortness Of Breath and Swelling    Throat and tongue swelling  . Penicillins Anaphylaxis and Swelling    Has patient had a PCN reaction causing immediate rash, facial/tongue/throat swelling, SOB or lightheadedness with hypotension:  Yes Has patient had a PCN reaction causing severe rash involving mucus membranes or skin necrosis: No Has patient had a PCN reaction that required hospitalization: No Has patient had a PCN reaction occurring within the last 10 years: No If all of the above answers are "NO", then may proceed with Cephalosporin use.   Marland Kitchen Pineapple Anaphylaxis, Hives and Swelling  . Lisinopril Cough     Review of Systems    Objective:   BP (!) 170/100 (BP Location: Left Arm, Patient Position: Sitting, Cuff Size: Normal)   Pulse 82   Resp 12   Ht 5' 9.5" (1.765 m)   Wt 214 lb (97.1 kg)   BMI 31.15 kg/m   Physical Exam  NAD Toenails long, but bases are clear and distal nail only with mild discoloration and subungual crumbling.  Flaking of feet resolved, but black adherent particles on plantar foot from shoes.   Assessment & Plan   1.  Toenail onychomycosis/tinea pedis:  Despite limited foot hygiene, his fungal involvement of foot and nails is improved.   Continue Terbinafine. Hepatic profile Encouraged treatment of shoes and cleaning of feet and shower floors. Follow up in 6 weeks.  2.  Hypertension:  HCTZ 25 mg daily with Losartan 100 mg.  CMP in 6 weeks.  3.  Hyperlipidemia:  Increase Atorvastatin to 60 mg daily with  repeat FLP in 6 weeks.  4.  COVID vaccine:  Discuss his increased risk if infected and science behind vaccine/side effects.  Encouraged him and his wife to reconsider.

## 2019-10-27 NOTE — Patient Instructions (Signed)
For foot and toenail fungus:  Spray your shoes with Lysol or Lotrimin Antifungal spray when you start the medication for your feet.   Re-spray your the shoes you wear with each wearing and allow to dry before wearing again You will need to continue to spray the shoes you have during the infection of your toenails and feet have been thrown out due to wear.  Once your toenails and feet are clear of infection, the shoes you buy new do not necessarily need to be sprayed. Clean your shower floor once to twice daily with bleach containing cleaner. 

## 2019-10-28 LAB — HEPATIC FUNCTION PANEL
ALT: 18 IU/L (ref 0–44)
AST: 13 IU/L (ref 0–40)
Albumin: 4.7 g/dL (ref 3.8–4.8)
Alkaline Phosphatase: 64 IU/L (ref 48–121)
Bilirubin Total: 0.5 mg/dL (ref 0.0–1.2)
Bilirubin, Direct: 0.16 mg/dL (ref 0.00–0.40)
Total Protein: 7 g/dL (ref 6.0–8.5)

## 2019-10-31 ENCOUNTER — Ambulatory Visit: Payer: Self-pay | Admitting: Orthopaedic Surgery

## 2019-11-21 ENCOUNTER — Encounter: Payer: Self-pay | Admitting: Orthopaedic Surgery

## 2019-11-21 ENCOUNTER — Ambulatory Visit (INDEPENDENT_AMBULATORY_CARE_PROVIDER_SITE_OTHER): Payer: Medicaid Other | Admitting: Orthopaedic Surgery

## 2019-11-21 ENCOUNTER — Ambulatory Visit (INDEPENDENT_AMBULATORY_CARE_PROVIDER_SITE_OTHER): Payer: Medicaid Other

## 2019-11-21 VITALS — BP 146/86 | HR 117 | Ht 69.0 in | Wt 213.0 lb

## 2019-11-21 DIAGNOSIS — G8929 Other chronic pain: Secondary | ICD-10-CM | POA: Diagnosis not present

## 2019-11-21 DIAGNOSIS — M545 Low back pain: Secondary | ICD-10-CM

## 2019-11-21 DIAGNOSIS — M542 Cervicalgia: Secondary | ICD-10-CM | POA: Diagnosis not present

## 2019-11-21 MED ORDER — DIAZEPAM 5 MG PO TABS: ORAL_TABLET | ORAL | 0 refills | Status: DC

## 2019-11-21 MED ORDER — IBUPROFEN 800 MG PO TABS: 800.0000 mg | ORAL_TABLET | Freq: Two times a day (BID) | ORAL | 1 refills | Status: DC | PRN
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
## 2019-12-01 ENCOUNTER — Telehealth: Payer: Self-pay

## 2019-12-01 NOTE — Telephone Encounter (Signed)
patient called back saying she received 2 missed calls from dr Lorin Mercy

## 2019-12-01 NOTE — Telephone Encounter (Signed)
Please see below and advise.

## 2019-12-02 NOTE — Telephone Encounter (Signed)
Think this was someone from our office calling to schedule lumbar MRI, not me. Needs MRI then ROV thanks

## 2019-12-04 NOTE — Telephone Encounter (Signed)
Saw where you were trying to reach patient for f/u appt after MRI with Dr Lorin Mercy. Can you please call patient?

## 2019-12-06 ENCOUNTER — Ambulatory Visit: Payer: Medicaid Other | Admitting: Internal Medicine

## 2019-12-08 ENCOUNTER — Other Ambulatory Visit: Payer: Medicaid Other

## 2019-12-11 ENCOUNTER — Other Ambulatory Visit (INDEPENDENT_AMBULATORY_CARE_PROVIDER_SITE_OTHER): Payer: Medicaid Other

## 2019-12-11 DIAGNOSIS — E782 Mixed hyperlipidemia: Secondary | ICD-10-CM

## 2019-12-11 DIAGNOSIS — Z79899 Other long term (current) drug therapy: Secondary | ICD-10-CM

## 2019-12-12 LAB — COMPREHENSIVE METABOLIC PANEL
ALT: 14 IU/L (ref 0–44)
AST: 12 IU/L (ref 0–40)
Albumin/Globulin Ratio: 2 (ref 1.2–2.2)
Albumin: 4.6 g/dL (ref 3.8–4.8)
Alkaline Phosphatase: 64 IU/L (ref 48–121)
BUN/Creatinine Ratio: 13 (ref 10–24)
BUN: 15 mg/dL (ref 8–27)
Bilirubin Total: 0.4 mg/dL (ref 0.0–1.2)
CO2: 23 mmol/L (ref 20–29)
Calcium: 9.4 mg/dL (ref 8.6–10.2)
Chloride: 103 mmol/L (ref 96–106)
Creatinine, Ser: 1.12 mg/dL (ref 0.76–1.27)
GFR calc Af Amer: 80 mL/min/{1.73_m2} (ref >59)
GFR calc non Af Amer: 70 mL/min/{1.73_m2} (ref >59)
Globulin, Total: 2.3 g/dL (ref 1.5–4.5)
Glucose: 106 mg/dL — ABNORMAL HIGH (ref 65–99)
Potassium: 4.8 mmol/L (ref 3.5–5.2)
Sodium: 141 mmol/L (ref 134–144)
Total Protein: 6.9 g/dL (ref 6.0–8.5)

## 2019-12-12 LAB — LIPID PANEL W/O CHOL/HDL RATIO
Cholesterol, Total: 125 mg/dL (ref 100–199)
HDL: 41 mg/dL (ref >39)
LDL Chol Calc (NIH): 70 mg/dL (ref 0–99)
Triglycerides: 68 mg/dL (ref 0–149)
VLDL Cholesterol Cal: 14 mg/dL (ref 5–40)

## 2019-12-13 ENCOUNTER — Ambulatory Visit (INDEPENDENT_AMBULATORY_CARE_PROVIDER_SITE_OTHER): Payer: Medicaid Other | Admitting: Internal Medicine

## 2019-12-13 ENCOUNTER — Encounter: Payer: Self-pay | Admitting: Internal Medicine

## 2019-12-13 ENCOUNTER — Encounter: Payer: Self-pay | Admitting: Gastroenterology

## 2019-12-13 VITALS — BP 160/92 | HR 88 | Resp 12 | Ht 69.5 in | Wt 201.0 lb

## 2019-12-13 DIAGNOSIS — Z1211 Encounter for screening for malignant neoplasm of colon: Secondary | ICD-10-CM

## 2019-12-13 DIAGNOSIS — Z114 Encounter for screening for human immunodeficiency virus [HIV]: Secondary | ICD-10-CM

## 2019-12-13 DIAGNOSIS — E782 Mixed hyperlipidemia: Secondary | ICD-10-CM

## 2019-12-13 DIAGNOSIS — Z125 Encounter for screening for malignant neoplasm of prostate: Secondary | ICD-10-CM

## 2019-12-13 DIAGNOSIS — B351 Tinea unguium: Secondary | ICD-10-CM

## 2019-12-13 DIAGNOSIS — Z1159 Encounter for screening for other viral diseases: Secondary | ICD-10-CM

## 2019-12-13 DIAGNOSIS — E118 Type 2 diabetes mellitus with unspecified complications: Secondary | ICD-10-CM

## 2019-12-13 DIAGNOSIS — B353 Tinea pedis: Secondary | ICD-10-CM

## 2019-12-13 DIAGNOSIS — I10 Essential (primary) hypertension: Secondary | ICD-10-CM

## 2019-12-13 MED ORDER — OLOPATADINE HCL 0.2 % OP SOLN: OPHTHALMIC | 11 refills | Status: DC

## 2019-12-13 NOTE — Progress Notes (Signed)
Subjective:    Patient ID: Spencer Walton., male   DOB: [birth year], 64 y.o.   MRN: [id number]   HPI   1.  Hyperlipidemia:  Much improved.  Continues Atorvastatin without problems Lipid Panel     Component Value Date/Time   CHOL 125 12/11/2019 0915   TRIG 68 12/11/2019 0915   HDL 41 12/11/2019 0915   LDLCALC 70 12/11/2019 0915   LABVLDL 14 12/11/2019 0915    2.  Toenail onychomycosis:  toeanails look better.  He is spraying inside of shoes with Lysol.  Also cleaning shower floor. CMP fine.   3.  Hypertension:  bp up today.  States he is out of HCTZ.  Discussed he has refills until June of 2022.  Discussed setting the same day of the month to call in and pick up with him and his wife.  They think that is something they can do.    4.  DM:  Sugars never higher than 150.  Last checked 2 weeks ago.  A1C in February was 06/16/19 Has not had eye exam this year.  Dr. Katy Fitch.   5.  Eyes are fuzzy and watery.  Worse when mowing grass.  Nose runs but does not really bother him.   Current Meds  Medication Sig  . atorvastatin (LIPITOR) 40 MG tablet 1.5 tabs daily with evening meal  . hydrochlorothiazide (HYDRODIURIL) 25 MG tablet 1 tab by mouth daily in morning with Losartan  . ibuprofen (ADVIL) 800 MG tablet Take 1 tablet (800 mg total) by mouth 2 (two) times daily as needed.  Marland Kitchen losartan (COZAAR) 100 MG tablet Take 1 tablet (100 mg total) by mouth daily.  . metFORMIN (GLUCOPHAGE) 500 MG tablet Take 1 tablet (500 mg total) by mouth 2 (two) times daily with a meal.   Allergies  Allergen Reactions  . Cherry Extract Anaphylaxis, Hives, Swelling and Rash  . Fruit & Vegetable Daily [Nutritional Supplements] Anaphylaxis, Hives, Swelling and Rash    Fruit cocktail: Lips swell  . Other Anaphylaxis and Swelling    NO NUTS!!!!  . Peach [Prunus Persica] Anaphylaxis and Swelling  . Pecan Nut (Diagnostic) Shortness Of Breath and Swelling    Throat and tongue swelling  . Penicillins  Anaphylaxis and Swelling    Has patient had a PCN reaction causing immediate rash, facial/tongue/throat swelling, SOB or lightheadedness with hypotension: Yes Has patient had a PCN reaction causing severe rash involving mucus membranes or skin necrosis: No Has patient had a PCN reaction that required hospitalization: No Has patient had a PCN reaction occurring within the last 10 years: No If all of the above answers are "NO", then may proceed with Cephalosporin use.   Marland Kitchen Pineapple Anaphylaxis, Hives and Swelling  . Lisinopril Cough     Review of Systems    Objective:   BP (!) 160/92 (BP Location: Left Arm, Patient Position: Sitting, Cuff Size: Large)   Pulse 88   Resp 12   Ht 5' 9.5" (1.765 m)   Wt 201 lb (91.2 kg)   BMI 29.26 kg/m   Physical Exam  NAD Lungs:  CTA CV:  RRR without murmur or rub.  Radial pulses normal and equal LE:  No edema.  Toenails with only very distal crumbling and thickening of nails with mild discoloration.   Assessment & Plan  1.  Hyperlipidemia:  Just about at goal.  Continue to work on diet and physical activity and continue Atorvastatin.  2.  DM:  Check A1C at  next follow up .  Has been well controlled.  3.  Hypertension:  Hopefully, will set up regular call in and pick up of meds to avoid missing meds in between fills.  Return in 4 weeks for recheck once back on HCTZ as well.  4.  Toenail Onychomycosis and tinea pedis:  Resolving nicely with 84 day treatment with Terbinafine.  Hepatic enzymes fine.  5.  Eye complaints:  Some of this sounds allergy related.  [place].  They will call to set up an appt with Dr. Katy Fitch as well.  Needs yearly check regardless.   6.  DM:  Check A1C prior to CPE in 4 months  7.  HM:  FAsting labs in 4 months:   FLP, CBC, CMP, A1C, urine microalbumin/crea.  With me for CPE just after.  PSA, Hepatitis C, HIV today.  Referral for screening colonoscopy.

## 2019-12-14 LAB — HIV ANTIBODY (ROUTINE TESTING W REFLEX): HIV Screen 4th Generation wRfx: NONREACTIVE

## 2019-12-14 LAB — PSA: Prostate Specific Ag, Serum: 5 ng/mL — ABNORMAL HIGH (ref 0.0–4.0)

## 2019-12-14 LAB — HEPATITIS C ANTIBODY: Hep C Virus Ab: <0.1 s/co ratio (ref 0.0–0.9)

## 2019-12-20 ENCOUNTER — Ambulatory Visit: Payer: Medicaid Other | Admitting: Podiatry

## 2019-12-20 ENCOUNTER — Encounter: Payer: Self-pay | Admitting: Podiatry

## 2019-12-20 ENCOUNTER — Other Ambulatory Visit: Payer: Self-pay

## 2019-12-20 DIAGNOSIS — E1142 Type 2 diabetes mellitus with diabetic polyneuropathy: Secondary | ICD-10-CM

## 2019-12-20 DIAGNOSIS — B351 Tinea unguium: Secondary | ICD-10-CM

## 2019-12-20 DIAGNOSIS — M79675 Pain in left toe(s): Secondary | ICD-10-CM | POA: Diagnosis not present

## 2019-12-20 DIAGNOSIS — M79674 Pain in right toe(s): Secondary | ICD-10-CM

## 2019-12-20 NOTE — Progress Notes (Signed)
This patient presents the office for treatment of his long painful toenails both feet.  Patient states that he has back problems and he is unable to self treat his nails.  Patient states the nails are painful walking wearing his shoes.   Patient states he is also diabetic and experiences pain and tingling at night during sleep.  He presents the office today for  nail care.  General Appearance  Alert, conversant and in no acute stress.  Vascular  Dorsalis pedis and posterior tibial  pulses are  weakly  palpable  bilaterally.  Capillary return is within normal limits  bilaterally. Temperature is within normal limits  bilaterally.  Neurologic  Senn-Weinstein monofilament wire test within normal limits  bilaterally. Muscle power within normal limits bilaterally.  Nails Thick disfigured discolored nails with subungual debris  from hallux to fifth toes bilaterally.  Orthopedic  No limitations of motion  feet .  No crepitus or effusions noted.  No bony pathology or digital deformities noted. Hammer toe 2-5  B/L.  Hallux malleus  B/L  Skin  normotropic skin with no porokeratosis noted bilaterally.  No signs of infections or ulcers noted.    Onychomycosis  X 10.  Diabetes  ROV.  Debride nails.  Discussed neuropathy with this patient RTC 3 months.    Gardiner Barefoot DPM

## 2019-12-21 ENCOUNTER — Other Ambulatory Visit: Payer: Medicaid Other

## 2019-12-21 DIAGNOSIS — R972 Elevated prostate specific antigen [PSA]: Secondary | ICD-10-CM

## 2019-12-22 ENCOUNTER — Telehealth: Payer: Self-pay

## 2019-12-22 LAB — PSA, TOTAL AND FREE
PSA, Free Pct: 12 %
PSA, Free: 0.53 ng/mL
Prostate Specific Ag, Serum: 4.4 ng/mL — ABNORMAL HIGH (ref 0.0–4.0)

## 2019-12-22 NOTE — Telephone Encounter (Signed)
Patient wife called in asking when should patient take medication before mri or after and how long does it take to become effective .

## 2019-12-22 NOTE — Telephone Encounter (Signed)
Also would need to have a driver in case medication made him drowsy

## 2019-12-22 NOTE — Telephone Encounter (Signed)
IC advised 1hr prior to procedure

## 2019-12-23 ENCOUNTER — Ambulatory Visit
Admission: RE | Admit: 2019-12-23 | Discharge: 2019-12-23 | Disposition: A | Discharge status: Home / Self Care | Payer: Medicaid Other | Source: Ambulatory Visit | Attending: Orthopaedic Surgery | Admitting: Orthopaedic Surgery

## 2019-12-23 ENCOUNTER — Other Ambulatory Visit: Payer: Self-pay

## 2019-12-23 DIAGNOSIS — M545 Low back pain, unspecified: Secondary | ICD-10-CM

## 2019-12-26 ENCOUNTER — Ambulatory Visit: Payer: Medicaid Other | Admitting: Orthopaedic Surgery

## 2019-12-27 ENCOUNTER — Encounter: Payer: Self-pay | Admitting: Orthopaedic Surgery

## 2019-12-27 ENCOUNTER — Ambulatory Visit (INDEPENDENT_AMBULATORY_CARE_PROVIDER_SITE_OTHER): Payer: Medicaid Other | Admitting: Orthopaedic Surgery

## 2019-12-27 DIAGNOSIS — M47816 Spondylosis without myelopathy or radiculopathy, lumbar region: Secondary | ICD-10-CM | POA: Diagnosis not present
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
## 2020-01-22 ENCOUNTER — Encounter: Payer: Self-pay | Admitting: Internal Medicine

## 2020-01-22 ENCOUNTER — Other Ambulatory Visit: Payer: Self-pay | Admitting: Internal Medicine

## 2020-01-22 DIAGNOSIS — R972 Elevated prostate specific antigen [PSA]: Secondary | ICD-10-CM

## 2020-01-22 HISTORY — DX: Elevated prostate specific antigen (PSA): R97.20

## 2020-01-30 ENCOUNTER — Telehealth: Payer: Self-pay

## 2020-01-30 NOTE — Telephone Encounter (Signed)
Pt was NS for PV.  Message left requesting a r/t call by 5:00 to prevent cancellation of procedure.

## 2020-01-30 NOTE — Telephone Encounter (Signed)
No return call to reschedule.  Procedure cancelled, NS letter sent.

## 2020-02-13 ENCOUNTER — Encounter: Payer: Medicaid Other | Admitting: Gastroenterology

## 2020-03-18 ENCOUNTER — Ambulatory Visit (AMBULATORY_SURGERY_CENTER): Payer: Self-pay | Admitting: *Deleted

## 2020-03-18 ENCOUNTER — Other Ambulatory Visit: Payer: Self-pay

## 2020-03-18 VITALS — Ht 69.5 in | Wt 214.0 lb

## 2020-03-18 DIAGNOSIS — Z01818 Encounter for other preprocedural examination: Secondary | ICD-10-CM

## 2020-03-18 DIAGNOSIS — Z800 Family history of malignant neoplasm of digestive organs: Secondary | ICD-10-CM

## 2020-03-18 DIAGNOSIS — Z1211 Encounter for screening for malignant neoplasm of colon: Secondary | ICD-10-CM

## 2020-03-18 MED ORDER — NA SULFATE-K SULFATE-MG SULF 17.5-3.13-1.6 GM/177ML PO SOLN: ORAL | 0 refills | Status: DC

## 2020-03-18 NOTE — Progress Notes (Signed)
Patient and wife is here in-person for PV. Patient denies any allergies to eggs or soy. Patient denies any problems with anesthesia/sedation. Patient denies any oxygen use at home. Patient denies taking any diet/weight loss medications or blood thinners. Patient is not being treated for MRSA or C-diff. Patient is aware of our care-partner policy and [id number] safety protocol.   COVID-19 test 11/24 9 am, pt is aware

## 2020-03-26 ENCOUNTER — Other Ambulatory Visit: Payer: Self-pay

## 2020-03-26 ENCOUNTER — Encounter: Payer: Self-pay | Admitting: Podiatry

## 2020-03-26 ENCOUNTER — Ambulatory Visit (INDEPENDENT_AMBULATORY_CARE_PROVIDER_SITE_OTHER): Payer: Medicaid Other | Admitting: Podiatry

## 2020-03-26 DIAGNOSIS — M79674 Pain in right toe(s): Secondary | ICD-10-CM

## 2020-03-26 DIAGNOSIS — M79675 Pain in left toe(s): Secondary | ICD-10-CM

## 2020-03-26 DIAGNOSIS — B351 Tinea unguium: Secondary | ICD-10-CM

## 2020-03-26 DIAGNOSIS — E1142 Type 2 diabetes mellitus with diabetic polyneuropathy: Secondary | ICD-10-CM

## 2020-03-26 NOTE — Progress Notes (Signed)
This patient presents the office for treatment of his long painful toenails both feet.  Patient states that he has back problems and he is unable to self treat his nails.  Patient states the he is diabetic..  He presents the office today for  nail care.  General Appearance  Alert, conversant and in no acute stress.  Vascular  Dorsalis pedis and posterior tibial  pulses are  weakly  palpable  bilaterally.  Capillary return is within normal limits  bilaterally. Temperature is within normal limits  bilaterally.  Neurologic  Senn-Weinstein monofilament wire test within normal limits  bilaterally. Muscle power within normal limits bilaterally.  Nails Thick disfigured discolored nails with subungual debris  from hallux to fifth toes bilaterally.  Orthopedic  No limitations of motion  feet .  No crepitus or effusions noted.  No bony pathology or digital deformities noted. Hammer toe 2-5  B/L.  Hallux malleus  B/L  Skin  normotropic skin with no porokeratosis noted bilaterally.  No signs of infections or ulcers noted.    Onychomycosis  X 10.  Diabetes  ROV.  Debride nails.  Discussed neuropathy with this patient RTC 3 months.    Beauty Pless DPM 

## 2020-03-27 ENCOUNTER — Other Ambulatory Visit: Payer: Self-pay | Admitting: Gastroenterology

## 2020-03-27 LAB — SARS CORONAVIRUS 2 (TAT 6-24 HRS): SARS Coronavirus 2: NEGATIVE

## 2020-04-01 ENCOUNTER — Encounter: Payer: Self-pay | Admitting: Gastroenterology

## 2020-04-01 ENCOUNTER — Ambulatory Visit (AMBULATORY_SURGERY_CENTER): Payer: Medicaid Other | Admitting: Gastroenterology

## 2020-04-01 ENCOUNTER — Other Ambulatory Visit: Payer: Self-pay

## 2020-04-01 VITALS — BP 133/90 | HR 83 | Temp 98.3°F | Resp 15 | Ht 69.5 in | Wt 214.0 lb

## 2020-04-01 DIAGNOSIS — D122 Benign neoplasm of ascending colon: Secondary | ICD-10-CM

## 2020-04-01 DIAGNOSIS — Z800 Family history of malignant neoplasm of digestive organs: Secondary | ICD-10-CM | POA: Diagnosis not present

## 2020-04-01 DIAGNOSIS — D125 Benign neoplasm of sigmoid colon: Secondary | ICD-10-CM

## 2020-04-01 DIAGNOSIS — K635 Polyp of colon: Secondary | ICD-10-CM | POA: Diagnosis not present

## 2020-04-01 DIAGNOSIS — D123 Benign neoplasm of transverse colon: Secondary | ICD-10-CM | POA: Diagnosis not present

## 2020-04-01 DIAGNOSIS — Z1211 Encounter for screening for malignant neoplasm of colon: Secondary | ICD-10-CM

## 2020-04-01 DIAGNOSIS — D120 Benign neoplasm of cecum: Secondary | ICD-10-CM

## 2020-04-01 DIAGNOSIS — D128 Benign neoplasm of rectum: Secondary | ICD-10-CM

## 2020-04-01 MED ORDER — SODIUM CHLORIDE 0.9 % IV SOLN: 500.0000 mL | Freq: Once | INTRAVENOUS | Status: DC

## 2020-04-01 NOTE — Progress Notes (Signed)
Called to room to assist during endoscopic procedure.  Patient ID and intended procedure confirmed with present staff. Received instructions for my participation in the procedure from the performing physician.  

## 2020-04-01 NOTE — Progress Notes (Signed)
Pt's states no medical or surgical changes since previsit or office visit.   VS taken by CW 

## 2020-04-01 NOTE — Progress Notes (Signed)
Robinul 0.2 mg IV given due large amount of secretions upon assessment.  MD made aware, vss 

## 2020-04-01 NOTE — Patient Instructions (Addendum)
Handout provided on polyps, high-fiber diet and diverticulosis.   Follow a high fiber diet. Drink at least 64 ounces of water daily. Add a daily stool bulking agent such as psyllium (an example would be Metamucil).  YOU HAD AN ENDOSCOPIC PROCEDURE TODAY AT [facility]:   Refer to the procedure report that was given to you for any specific questions about what was found during the examination.  If the procedure report does not answer your questions, please call your gastroenterologist to clarify.  If you requested that your care partner not be given the details of your procedure findings, then the procedure report has been included in a sealed envelope for you to review at your convenience later.  YOU SHOULD EXPECT: Some feelings of bloating in the abdomen. Passage of more gas than usual.  Walking can help get rid of the air that was put into your GI tract during the procedure and reduce the bloating. If you had a lower endoscopy (such as a colonoscopy or flexible sigmoidoscopy) you may notice spotting of blood in your stool or on the toilet paper. If you underwent a bowel prep for your procedure, you may not have a normal bowel movement for a few days.  Please Note:  You might notice some irritation and congestion in your nose or some drainage.  This is from the oxygen used during your procedure.  There is no need for concern and it should clear up in a day or so.  SYMPTOMS TO REPORT IMMEDIATELY:   Following lower endoscopy (colonoscopy or flexible sigmoidoscopy):  Excessive amounts of blood in the stool  Significant tenderness or worsening of abdominal pains  Swelling of the abdomen that is new, acute  Fever of 100F or higher  For urgent or emergent issues, a gastroenterologist can be reached at any hour by calling [phone]. Do not use MyChart messaging for urgent concerns.    DIET:  We do recommend a small meal at first, but then you may proceed to your regular diet.   Drink plenty of fluids but you should avoid alcoholic beverages for 24 hours.  ACTIVITY:  You should plan to take it easy for the rest of today and you should NOT DRIVE or use heavy machinery until tomorrow (because of the sedation medicines used during the test).    FOLLOW UP: Our staff will call the number listed on your records 48-72 hours following your procedure to check on you and address any questions or concerns that you may have regarding the information given to you following your procedure. If we do not reach you, we will leave a message.  We will attempt to reach you two times.  During this call, we will ask if you have developed any symptoms of COVID 19. If you develop any symptoms (ie: fever, flu-like symptoms, shortness of breath, cough etc.) before then, please call [phone].  If you test positive for Covid 19 in the 2 weeks post procedure, please call and report this information to [place].    If any biopsies were taken you will be contacted by phone or by letter within the next 1-3 weeks.  Please call us at [phone] if you have not heard about the biopsies in 3 weeks.    SIGNATURES/CONFIDENTIALITY: You and/or your care partner have signed paperwork which will be entered into your electronic medical record.  These signatures attest to the fact that that the information above on your After Visit Summary has been reviewed  and is understood.  Full responsibility of the confidentiality of this discharge information lies with you and/or your care-partner.

## 2020-04-01 NOTE — Op Note (Signed)
[facility] Patient Name: Spencer Walton Procedure Date: 04/01/2020 8:42 AM MRN: [id number] Endoscopist: Thornton Park MD, MD Age: 64 Referring MD:  Date of Birth: 04-21-1956 Gender: Male Account #: [id number] Procedure:                Colonoscopy Indications:              Screening patient at increased risk: Family history                            of 1st-degree relative with colorectal cancer at                            age 61 years (or older), This is the patient's                            first colonoscopy                           Mother with colon cancer at age 5                           No other known family members with colon cancer or                            polyps Medicines:                Monitored Anesthesia Care Procedure:                Pre-Anesthesia Assessment:                           - Prior to the procedure, a History and Physical                            was performed, and patient medications and                            allergies were reviewed. The patient's tolerance of                            previous anesthesia was also reviewed. The risks                            and benefits of the procedure and the sedation                            options and risks were discussed with the patient.                            All questions were answered, and informed consent                            was obtained. Prior Anticoagulants: The patient has  taken no previous anticoagulant or antiplatelet                            agents. ASA Grade Assessment: II - A patient with                            mild systemic disease. After reviewing the risks                            and benefits, the patient was deemed in                            satisfactory condition to undergo the procedure.                           After obtaining informed consent, the colonoscope                            was passed under  direct vision. Throughout the                            procedure, the patient's blood pressure, pulse, and                            oxygen saturations were monitored continuously. The                            Colonoscope was introduced through the anus and                            advanced to the 3 cm into the ileum. The                            colonoscopy was performed without difficulty. The                            patient tolerated the procedure well. The quality                            of the bowel preparation was good. The terminal                            ileum, ileocecal valve, appendiceal orifice, and                            rectum were photographed. Scope In: 8:54:49 AM Scope Out: 9:13:20 AM Scope Withdrawal Time: 0 hours 16 minutes 37 seconds  Total Procedure Duration: 0 hours 18 minutes 31 seconds  Findings:                 The perianal and digital rectal examinations were                            normal.  Multiple small and large-mouthed diverticula were                            found in the sigmoid colon, descending colon and                            ascending colon.                           A 4 mm polyp was found in the rectum. The polyp was                            semi-pedunculated. The polyp was removed with a                            cold snare. Resection and retrieval were complete.                            Estimated blood loss was minimal.                           A 15 mm polyp was found in the sigmoid colon, 36 cm                            from the anal verge. The polyp was pedunculated.                            The polyp was removed with a cold snare. Resection                            and retrieval were complete. Estimated blood loss                            was minimal.                           Two sessile polyps were found in the transverse                            colon. The polyps were 2 to  4 mm in size. These                            polyps were removed with a cold snare. Resection                            and retrieval were complete. Estimated blood loss                            was minimal.                           A 2 mm polyp was found in the ascending colon. The  polyp was flat. The polyp was removed with a cold                            snare. Resection and retrieval were complete.                            Estimated blood loss was minimal.                           A 4 mm polyp was found in the proximal ascending                            colon. The polyp was flat. The polyp was removed                            with a cold snare. Resection and retrieval were                            complete. Estimated blood loss was minimal.                           The exam was otherwise without abnormality on                            direct and retroflexion views. Complications:            No immediate complications. Estimated blood loss:                            Minimal. Estimated Blood Loss:     Estimated blood loss was minimal. Impression:               - Diverticulosis in the sigmoid colon, in the                            descending colon and in the ascending colon.                           - One 4 mm polyp in the rectum, removed with a cold                            snare. Resected and retrieved.                           - One 15 mm polyp in the sigmoid colon, removed                            with a cold snare. Resected and retrieved.                           - Two 2 to 4 mm polyps in the transverse colon,                            removed with a  cold snare. Resected and retrieved.                           - One 2 mm polyp in the ascending colon, removed                            with a cold snare. Resected and retrieved.                           - One 4 mm polyp in the proximal ascending colon,                             removed with a cold snare. Resected and retrieved.                           - The examination was otherwise normal on direct                            and retroflexion views. Recommendation:           - Patient has a contact number available for                            emergencies. The signs and symptoms of potential                            delayed complications were discussed with the                            patient. Return to normal activities tomorrow.                            Written discharge instructions were provided to the                            patient.                           - Follow a high fiber diet. Drink at least 64                            ounces of water daily. Add a daily stool bulking                            agent such as psyllium (an exampled would be                            Metamucil).                           - Continue present medications.                           - Await pathology results.                           -  Repeat colonoscopy date to be determined after                            pending pathology results are reviewed for                            surveillance.                           - Emerging evidence supports eating a diet of                            fruits, vegetables, grains, calcium, and yogurt                            while reducing red meat and alcohol may reduce the                            risk of colon cancer.                           - Given these results, all first degree relatives                            (brothers, sisters, children, parents) should start                            colon cancer screening at age 3.                           - Thank you for allowing me to be involved in your                            colon cancer prevention. Thornton Park MD, MD 04/01/2020 9:20:54 AM This report has been signed electronically.

## 2020-04-01 NOTE — Progress Notes (Signed)
Report given to PACU, vss 

## 2020-04-03 ENCOUNTER — Telehealth: Payer: Self-pay

## 2020-04-03 ENCOUNTER — Telehealth: Payer: Self-pay | Admitting: Internal Medicine

## 2020-04-03 ENCOUNTER — Telehealth: Payer: Self-pay | Admitting: *Deleted

## 2020-04-03 NOTE — Telephone Encounter (Signed)
LVM

## 2020-04-03 NOTE — Telephone Encounter (Signed)
  Follow up Call-  Call back number 04/01/2020  Post procedure Call Back phone  # [phone]  Permission to leave phone message Yes  Some recent data might be hidden    LMOM to call back with any questions or concerns.  Also, call back if patient has developed fever, respiratory issues or been dx with COVID or had any family members or close contacts diagnosed since her procedure.

## 2020-04-05 ENCOUNTER — Encounter: Payer: Self-pay | Admitting: Gastroenterology

## 2020-04-15 ENCOUNTER — Other Ambulatory Visit: Payer: Medicaid Other

## 2020-04-15 ENCOUNTER — Other Ambulatory Visit: Payer: Self-pay

## 2020-04-15 ENCOUNTER — Other Ambulatory Visit: Payer: Medicaid Other | Admitting: Internal Medicine

## 2020-04-15 DIAGNOSIS — R809 Proteinuria, unspecified: Secondary | ICD-10-CM

## 2020-04-15 DIAGNOSIS — Z79899 Other long term (current) drug therapy: Secondary | ICD-10-CM

## 2020-04-15 DIAGNOSIS — E119 Type 2 diabetes mellitus without complications: Secondary | ICD-10-CM

## 2020-04-15 DIAGNOSIS — E782 Mixed hyperlipidemia: Secondary | ICD-10-CM

## 2020-04-15 NOTE — Progress Notes (Signed)
Lab visit

## 2020-04-16 LAB — CBC WITH DIFFERENTIAL/PLATELET
Basophils Absolute: 0.1 10*3/uL (ref 0.0–0.2)
Basos: 1 %
EOS (ABSOLUTE): 0.2 10*3/uL (ref 0.0–0.4)
Eos: 4 %
Hematocrit: 40.4 % (ref 37.5–51.0)
Hemoglobin: 13.8 g/dL (ref 13.0–17.7)
Immature Grans (Abs): 0 10*3/uL (ref 0.0–0.1)
Immature Granulocytes: 0 %
Lymphocytes Absolute: 2.2 10*3/uL (ref 0.7–3.1)
Lymphs: 36 %
MCH: 31.9 pg (ref 26.6–33.0)
MCHC: 34.2 g/dL (ref 31.5–35.7)
MCV: 93 fL (ref 79–97)
Monocytes Absolute: 0.4 10*3/uL (ref 0.1–0.9)
Monocytes: 6 %
Neutrophils Absolute: 3.2 10*3/uL (ref 1.4–7.0)
Neutrophils: 53 %
Platelets: 256 10*3/uL (ref 150–450)
RBC: 4.33 x10E6/uL (ref 4.14–5.80)
RDW: 12.4 % (ref 11.6–15.4)
WBC: 6.1 10*3/uL (ref 3.4–10.8)

## 2020-04-16 LAB — COMPREHENSIVE METABOLIC PANEL
ALT: 23 IU/L (ref 0–44)
AST: 16 IU/L (ref 0–40)
Albumin/Globulin Ratio: 2 (ref 1.2–2.2)
Albumin: 4.8 g/dL (ref 3.8–4.8)
Alkaline Phosphatase: 66 IU/L (ref 44–121)
BUN/Creatinine Ratio: 17 (ref 10–24)
BUN: 15 mg/dL (ref 8–27)
Bilirubin Total: 0.5 mg/dL (ref 0.0–1.2)
CO2: 24 mmol/L (ref 20–29)
Calcium: 9.9 mg/dL (ref 8.6–10.2)
Chloride: 99 mmol/L (ref 96–106)
Creatinine, Ser: 0.87 mg/dL (ref 0.76–1.27)
GFR calc Af Amer: 106 mL/min/{1.73_m2} (ref >59)
GFR calc non Af Amer: 92 mL/min/{1.73_m2} (ref >59)
Globulin, Total: 2.4 g/dL (ref 1.5–4.5)
Glucose: 144 mg/dL — ABNORMAL HIGH (ref 65–99)
Potassium: 4 mmol/L (ref 3.5–5.2)
Sodium: 138 mmol/L (ref 134–144)
Total Protein: 7.2 g/dL (ref 6.0–8.5)

## 2020-04-16 LAB — LIPID PANEL W/O CHOL/HDL RATIO
Cholesterol, Total: 147 mg/dL (ref 100–199)
HDL: 46 mg/dL (ref >39)
LDL Chol Calc (NIH): 80 mg/dL (ref 0–99)
Triglycerides: 119 mg/dL (ref 0–149)
VLDL Cholesterol Cal: 21 mg/dL (ref 5–40)

## 2020-04-16 LAB — MICROALBUMIN / CREATININE URINE RATIO
Creatinine, Urine: 72.3 mg/dL
Microalb/Creat Ratio: 31 mg/g creat — ABNORMAL HIGH (ref 0–29)
Microalbumin, Urine: 22.3 ug/mL

## 2020-04-16 LAB — HEMOGLOBIN A1C
Est. average glucose Bld gHb Est-mCnc: 143 mg/dL
Hgb A1c MFr Bld: 6.6 % — ABNORMAL HIGH (ref 4.8–5.6)

## 2020-04-19 ENCOUNTER — Encounter: Payer: Medicaid Other | Admitting: Internal Medicine

## 2020-05-02 ENCOUNTER — Telehealth: Payer: Self-pay | Admitting: Clinical

## 2020-05-02 MED ORDER — LOSARTAN POTASSIUM 50 MG PO TABS: ORAL_TABLET | ORAL | 5 refills | Status: DC

## 2020-05-02 NOTE — Telephone Encounter (Signed)
Switched to 50 mg tabs to take 2 tabs once daily

## 2020-05-02 NOTE — Telephone Encounter (Signed)
Losartan not available in 100 mg strength

## 2020-05-29 ENCOUNTER — Other Ambulatory Visit: Payer: Self-pay | Admitting: Urology

## 2020-05-30 ENCOUNTER — Ambulatory Visit (INDEPENDENT_AMBULATORY_CARE_PROVIDER_SITE_OTHER): Payer: Medicaid Other | Admitting: Internal Medicine

## 2020-05-30 ENCOUNTER — Other Ambulatory Visit: Payer: Self-pay | Admitting: Urology

## 2020-05-30 ENCOUNTER — Other Ambulatory Visit: Payer: Self-pay

## 2020-05-30 ENCOUNTER — Encounter: Payer: Self-pay | Admitting: Internal Medicine

## 2020-05-30 VITALS — BP 174/92 | HR 102 | Resp 12 | Ht 69.5 in | Wt 205.5 lb

## 2020-05-30 DIAGNOSIS — E782 Mixed hyperlipidemia: Secondary | ICD-10-CM

## 2020-05-30 DIAGNOSIS — F419 Anxiety disorder, unspecified: Secondary | ICD-10-CM

## 2020-05-30 DIAGNOSIS — M5442 Lumbago with sciatica, left side: Secondary | ICD-10-CM | POA: Diagnosis not present

## 2020-05-30 DIAGNOSIS — I10 Essential (primary) hypertension: Secondary | ICD-10-CM | POA: Diagnosis not present

## 2020-05-30 DIAGNOSIS — G8929 Other chronic pain: Secondary | ICD-10-CM

## 2020-05-30 DIAGNOSIS — E118 Type 2 diabetes mellitus with unspecified complications: Secondary | ICD-10-CM

## 2020-05-30 MED ORDER — CITALOPRAM HYDROBROMIDE 10 MG PO TABS: 10.0000 mg | ORAL_TABLET | Freq: Every day | ORAL | 2 refills | Status: DC

## 2020-05-30 MED ORDER — HYDROCHLOROTHIAZIDE 25 MG PO TABS: ORAL_TABLET | ORAL | 3 refills | Status: DC

## 2020-05-30 MED ORDER — GABAPENTIN 100 MG PO CAPS: ORAL_CAPSULE | ORAL | 3 refills | Status: DC

## 2020-05-30 NOTE — Progress Notes (Signed)
Subjective:    Patient ID: Spencer Tiegs., male   DOB: Jul 30, 1955, 65 y.o.   MRN: [id number]   HPI   Here for medical clearance for radical prostatectomy with prostate cancer.  1.  Hypertension:  States he has not been taking his HCTZ for a week as pharmacy unable to obtain.  He checked with [facility] as well and were told on back order  2.  DM:  Recent A1C 04/15/20 shows good control at 6.6%.  Sugars in 130 to 140 range when checked.    3.  CV:  No exertional chest pain or dyspnea.  Describes shoveling snow and tree work.  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   Sleeps on one pillow without difficulties.   Denies PND. No LE edema.   Cholesterol recently not quite at goal with LDL measuring 80.  Previously at goal in August, however.   ECG today with NSR, no signs of ischemia and borderline LVH. Preop risk with Revised Cardiac Risk Index low at 0.4%  4.  Non smoker and no history of lung disease.  5.  Anxiety/Alcohol overuse:   Has a lot of anxiety keeping him up at night.  Doesn't sleep well.  Drinking 4 bottles of wine per week at night to try and sleep.  6.  Left side pain:  Has the pain in his low left back that radiates down to groin and then down the front of thigh to knee.  Has been evaluated by Dr. Lorin Mercy.  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  Current Meds  Medication Sig   atorvastatin (LIPITOR) 40 MG tablet 1.5 tabs daily with evening meal   ibuprofen (ADVIL) 800 MG tablet Take 1 tablet (800 mg total) by mouth 2 (two) times daily as needed.   losartan (COZAAR) 50 MG tablet 2 tabs by mouth once daily   metFORMIN (GLUCOPHAGE) 500 MG tablet Take 1 tablet (500 mg total) by mouth 2 (two) times daily with a meal.   Allergies  Allergen Reactions   Cherry Extract Anaphylaxis, Hives, Swelling and Rash   Fruit & Vegetable Daily [Nutritional Supplements] Anaphylaxis, Hives, Swelling and Rash    Fruit cocktail:  Lips swell   Other Anaphylaxis and Swelling    NO NUTS!!!!   Peach [Prunus Persica] Anaphylaxis and Swelling   Pecan Nut (Diagnostic) Shortness Of Breath and Swelling    Throat and tongue swelling   Penicillins Anaphylaxis and Swelling    Has patient had a PCN reaction causing immediate rash, facial/tongue/throat swelling, SOB or lightheadedness with hypotension: Yes Has patient had a PCN reaction causing severe rash involving mucus membranes or skin necrosis: No Has patient had a PCN reaction that required hospitalization: No Has patient had a PCN reaction occurring within the last 10 years: No If all of the above answers are "NO", then may proceed with Cephalosporin use.    Pineapple Anaphylaxis, Hives and Swelling   Lisinopril Cough     Review of Systems    Objective:   BP (!) 174/92 (BP Location: Left Arm, Patient Position: Sitting, Cuff Size: Normal)    Pulse (!) 102    Resp 12    Ht 5' 9.5" (1.765 m)    Wt 205 lb 8 oz (93.2 kg)    BMI 29.91 kg/m   Physical Exam  NAD HEENT:  PERRL, EOMI, TMs pearly gray, throat without injection.  Many missing teeth with dental decay Neck:  Supple,  No adenopathy, no thyromegaly Chest:  CTA CV:  RRR with normal S1 and S2, No S3, S4 or murmur.  No carotid bruits.  Carotid, radial and DP pulses normal and equal. Abd:  Obese, NT, + BS, No HSM or mass. LE:   No edema. Neuro: A & O x 3, CN II-XII grossly intact.  DTRs 2+/4 throughout.  Motor 5/5 throughout.   MS:  No obvious cervical stooping or flattening of lumbar curvature.   Assessment & Plan  1.  Medically cleared for prostatectomy.  Discussed would like to see bp controlled, however.  Sent HCTZ to [facility] where it is still available.  Repeat BP check in 1 week with me.  2  Hypertension:  As above.  Continue Losartan/HCTZ with surgery  3.  DM:  Well controlled.  To continue meds with surgery.  4.  Hyperlipidemia:  Fair control.  Needs LDL just a bit lower  5.  Depression  and Anxiety with insomnia with self medication via alcohol.  Citalopram in the morning.  Follow up in 1 week.  Call if a problem. Initiating Gabapentin for lumbar radiculopathy symptoms as below. Have asked him to drink no more than 1 glass of wine nightly.  6.  Lumbar pain with mainly left radiculopathy:  Gabapentin 100 mg at bedtime and increase by 100 mg every 3 days to max of 300 mg at bedtime.  Also, to help with insomnia.   Will need to discuss with Dr. Lorin Mercy the possibility of ankylosing spondylitis as a diagnosis based on MR findings--whether needs further work up: CRP, HLA B27 or just findings of DJD--consider labs at follow up in 1 week.Marland Kitchen

## 2020-06-04 NOTE — Telephone Encounter (Signed)
Patient came to an appointment in person.

## 2020-06-06 ENCOUNTER — Ambulatory Visit (INDEPENDENT_AMBULATORY_CARE_PROVIDER_SITE_OTHER): Payer: Medicaid Other | Admitting: Internal Medicine

## 2020-06-06 ENCOUNTER — Encounter: Payer: Self-pay | Admitting: Internal Medicine

## 2020-06-06 ENCOUNTER — Other Ambulatory Visit: Payer: Self-pay

## 2020-06-06 VITALS — BP 147/84 | HR 107 | Resp 12 | Ht 69.5 in | Wt 204.0 lb

## 2020-06-06 DIAGNOSIS — I10 Essential (primary) hypertension: Secondary | ICD-10-CM | POA: Diagnosis not present

## 2020-06-06 DIAGNOSIS — G4709 Other insomnia: Secondary | ICD-10-CM

## 2020-06-06 DIAGNOSIS — G8929 Other chronic pain: Secondary | ICD-10-CM

## 2020-06-06 DIAGNOSIS — F419 Anxiety disorder, unspecified: Secondary | ICD-10-CM | POA: Diagnosis not present

## 2020-06-06 DIAGNOSIS — M5442 Lumbago with sciatica, left side: Secondary | ICD-10-CM | POA: Diagnosis not present

## 2020-06-06 NOTE — Progress Notes (Signed)
    Subjective:    Patient ID: Braelynn Lupton., male   DOB: February 27, 1956, 65 y.o.   MRN: [id number]   HPI   1.  Hypertension:  Feeling better since back on HCTZ.    2.  Back pain with radiculopathy:  Doing well with gabapentin and only taking 100 mg at bedtime.  Sounds like his back pain is improved somewhat as well.    3.  Anxiety and insomnia:  Taking citalopram daily.  No thoughts of suicide.  Current Meds  Medication Sig   atorvastatin (LIPITOR) 40 MG tablet 1.5 tabs daily with evening meal   citalopram (CELEXA) 10 MG tablet Take 1 tablet (10 mg total) by mouth daily.   diazepam (VALIUM) 5 MG tablet Take as directed prior to procedure.   gabapentin (NEURONTIN) 100 MG capsule 1 cap by mouth at bedtime daily to increase to 2 caps in 3 days and 3 caps in 6 days as tolerated.   hydrochlorothiazide (HYDRODIURIL) 25 MG tablet 1 tab by mouth daily in morning with Losartan   ibuprofen (ADVIL) 800 MG tablet Take 1 tablet (800 mg total) by mouth 2 (two) times daily as needed.   losartan (COZAAR) 50 MG tablet 2 tabs by mouth once daily   metFORMIN (GLUCOPHAGE) 500 MG tablet Take 1 tablet (500 mg total) by mouth 2 (two) times daily with a meal.   Allergies  Allergen Reactions   Cherry Extract Anaphylaxis, Hives, Swelling and Rash   Fruit & Vegetable Daily [Nutritional Supplements] Anaphylaxis, Hives, Swelling and Rash    Fruit cocktail: Lips swell   Other Anaphylaxis and Swelling    NO NUTS!!!!   Peach [Prunus Persica] Anaphylaxis and Swelling   Pecan Nut (Diagnostic) Shortness Of Breath and Swelling    Throat and tongue swelling   Penicillins Anaphylaxis and Swelling    Has patient had a PCN reaction causing immediate rash, facial/tongue/throat swelling, SOB or lightheadedness with hypotension: Yes Has patient had a PCN reaction causing severe rash involving mucus membranes or skin necrosis: No Has patient had a PCN reaction that required hospitalization: No Has patient had a PCN  reaction occurring within the last 10 years: No If all of the above answers are "NO", then may proceed with Cephalosporin use.    Pineapple Anaphylaxis, Hives and Swelling   Lisinopril Cough     Review of Systems    Objective:   BP (!) 147/84 (BP Location: Left Arm, Patient Position: Sitting, Cuff Size: Normal)   Pulse (!) 107   Resp 12   Ht 5' 9.5" (1.765 m)   Wt 204 lb (92.5 kg)   BMI 29.69 kg/m   Physical Exam Looks much better--lot more energy and happier appearing. Lungs:  CTA CV:  RRR without murmur or rub.  Radial and DP pulses normal and equal  Assessment & Plan  Hypertension:  still high.  Will see if still elevated in April  2.  Anxiety and insomnia:  improved on Citalopram.  Gabapentin likely also helping with sleep.  3.  Chronic left back pain:  improved with low dose Gabapentin.

## 2020-06-25 NOTE — Patient Instructions (Addendum)
DUE TO COVID-19 ONLY ONE VISITOR IS ALLOWED TO COME WITH YOU AND STAY IN THE WAITING ROOM ONLY DURING PRE OP AND PROCEDURE DAY OF SURGERY. THE 1 VISITOR  MAY VISIT WITH YOU AFTER SURGERY IN YOUR PRIVATE ROOM DURING VISITING HOURS ONLY!  YOU NEED TO HAVE A COVID 19 TEST ON: 07/02/20 @  1:00 PM , THIS TEST MUST BE DONE BEFORE SURGERY,  COVID TESTING SITE [place] [id number], IT IS ON THE RIGHT GOING OUT [address] APPROXIMATELY  2 MINUTES PAST [employer] ON THE RIGHT. ONCE YOUR COVID TEST IS COMPLETED,  PLEASE BEGIN THE QUARANTINE INSTRUCTIONS AS OUTLINED IN YOUR HANDOUT.                Spencer Walton.    Your procedure is scheduled on: 07/05/20   Report to [facility] Main  Entrance   Report to short stay at: 5:30 AM     Call this number if you have problems the morning of surgery [phone]    Remember: Do not eat food or drink liquids :After Midnight.   BRUSH YOUR TEETH MORNING OF SURGERY AND RINSE YOUR MOUTH OUT, NO CHEWING GUM CANDY OR MINTS.    Take these medicines the morning of surgery with A SIP OF WATER: citalopram  How to Manage Your Diabetes Before and After Surgery  Why is it important to control my blood sugar before and after surgery? . Improving blood sugar levels before and after surgery helps healing and can limit problems. . A way of improving blood sugar control is eating a healthy diet by: o  Eating less sugar and carbohydrates o  Increasing activity/exercise o  Talking with your doctor about reaching your blood sugar goals . High blood sugars (greater than 180 mg/dL) can raise your risk of infections and slow your recovery, so you will need to focus on controlling your diabetes during the weeks before surgery. . Make sure that the doctor who takes care of your diabetes knows about your planned surgery including the date and location.  How do I manage my blood sugar before surgery? . Check your blood sugar at  least 4 times a day, starting 2 days before surgery, to make sure that the level is not too high or low. o Check your blood sugar the morning of your surgery when you wake up and every 2 hours until you get to the Short Stay unit. . If your blood sugar is less than 70 mg/dL, you will need to treat for low blood sugar: o Do not take insulin. o Treat a low blood sugar (less than 70 mg/dL) with  cup of clear juice (cranberry or apple), 4 glucose tablets, OR glucose gel. o Recheck blood sugar in 15 minutes after treatment (to make sure it is greater than 70 mg/dL). If your blood sugar is not greater than 70 mg/dL on recheck, call [phone] for further instructions. . Report your blood sugar to the short stay nurse when you get to Short Stay.  . If you are admitted to the hospital after surgery: o Your blood sugar will be checked by the staff and you will probably be given insulin after surgery (instead of oral diabetes medicines) to make sure you have good blood sugar levels. o The goal for blood sugar control after surgery is 80-180 mg/dL.   WHAT DO I DO ABOUT MY DIABETES MEDICATION?  Marland Kitchen Do not take oral diabetes medicines (pills) the morning of  surgery.  THE DAY BEFORE SURGERY, take  Metformin as usual.   THE MORNING OF SURGERY, DO NOT TAKE ANY DIABETIC MEDICATIONS DAY OF YOUR SURGERY                               You may not have any metal on your body including hair pins and              piercings  Do not wear jewelry,lotions, powders or perfumes, deodorant             Men may shave face and neck.   Do not bring valuables to the hospital. [place].  Contacts, dentures or bridgework may not be worn into surgery.  Leave suitcase in the car. After surgery it may be brought to your room.     Patients discharged the day of surgery will not be allowed to drive home. IF YOU ARE HAVING SURGERY AND GOING HOME THE SAME DAY, YOU MUST HAVE AN  ADULT TO DRIVE YOU HOME AND BE WITH YOU FOR 24 HOURS. YOU MAY GO HOME BY TAXI OR UBER OR ORTHERWISE, BUT AN ADULT MUST ACCOMPANY YOU HOME AND STAY WITH YOU FOR 24 HOURS.  Name and phone number of your driver:  Special Instructions: N/A              Please read over the following fact sheets you were given: _____________________________________________________________________          [facility] - Preparing for Surgery Before surgery, you can play an important role.  Because skin is not sterile, your skin needs to be as free of germs as possible.  You can reduce the number of germs on your skin by washing with CHG (chlorahexidine gluconate) soap before surgery.  CHG is an antiseptic cleaner which kills germs and bonds with the skin to continue killing germs even after washing. Please DO NOT use if you have an allergy to CHG or antibacterial soaps.  If your skin becomes reddened/irritated stop using the CHG and inform your nurse when you arrive at Short Stay. Do not shave (including legs and underarms) for at least 48 hours prior to the first CHG shower.  You may shave your face/neck. Please follow these instructions carefully:  1.  Shower with CHG Soap the night before surgery and the  morning of Surgery.  2.  If you choose to wash your hair, wash your hair first as usual with your  normal  shampoo.  3.  After you shampoo, rinse your hair and body thoroughly to remove the  shampoo.                           4.  Use CHG as you would any other liquid soap.  You can apply chg directly  to the skin and wash                       Gently with a scrungie or clean washcloth.  5.  Apply the CHG Soap to your body ONLY FROM THE NECK DOWN.   Do not use on face/ open                           Wound  or open sores. Avoid contact with eyes, ears mouth and genitals (private parts).                       Wash face,  Genitals (private parts) with your normal soap.             6.  Wash thoroughly, paying special  attention to the area where your surgery  will be performed.  7.  Thoroughly rinse your body with warm water from the neck down.  8.  DO NOT shower/wash with your normal soap after using and rinsing off  the CHG Soap.                9.  Pat yourself dry with a clean towel.            10.  Wear clean pajamas.            11.  Place clean sheets on your bed the night of your first shower and do not  sleep with pets. Day of Surgery : Do not apply any lotions/deodorants the morning of surgery.  Please wear clean clothes to the hospital/surgery center.  FAILURE TO FOLLOW THESE INSTRUCTIONS MAY RESULT IN THE CANCELLATION OF YOUR SURGERY PATIENT SIGNATURE_________________________________  NURSE SIGNATURE__________________________________  ________________________________________________________________________   Adam Phenix  An incentive spirometer is a tool that can help keep your lungs clear and active. This tool measures how well you are filling your lungs with each breath. Taking long deep breaths may help reverse or decrease the chance of developing breathing (pulmonary) problems (especially infection) following:  A long period of time when you are unable to move or be active. BEFORE THE PROCEDURE   If the spirometer includes an indicator to show your best effort, your nurse or respiratory therapist will set it to a desired goal.  If possible, sit up straight or lean slightly forward. Try not to slouch.  Hold the incentive spirometer in an upright position. INSTRUCTIONS FOR USE  1. Sit on the edge of your bed if possible, or sit up as far as you can in bed or on a chair. 2. Hold the incentive spirometer in an upright position. 3. Breathe out normally. 4. Place the mouthpiece in your mouth and seal your lips tightly around it. 5. Breathe in slowly and as deeply as possible, raising the piston or the ball toward the top of the column. 6. Hold your breath for 3-5 seconds or for  as long as possible. Allow the piston or ball to fall to the bottom of the column. 7. Remove the mouthpiece from your mouth and breathe out normally. 8. Rest for a few seconds and repeat Steps 1 through 7 at least 10 times every 1-2 hours when you are awake. Take your time and take a few normal breaths between deep breaths. 9. The spirometer may include an indicator to show your best effort. Use the indicator as a goal to work toward during each repetition. 10. After each set of 10 deep breaths, practice coughing to be sure your lungs are clear. If you have an incision (the cut made at the time of surgery), support your incision when coughing by placing a pillow or rolled up towels firmly against it. Once you are able to get out of bed, walk around indoors and cough well. You may stop using the incentive spirometer when instructed by your caregiver.  RISKS AND COMPLICATIONS  Take your time so you do not get  dizzy or light-headed.  If you are in pain, you may need to take or ask for pain medication before doing incentive spirometry. It is harder to take a deep breath if you are having pain. AFTER USE  Rest and breathe slowly and easily.  It can be helpful to keep track of a log of your progress. Your caregiver can provide you with a simple table to help with this. If you are using the spirometer at home, follow these instructions: [place] IF:   You are having difficultly using the spirometer.  You have trouble using the spirometer as often as instructed.  Your pain medication is not giving enough relief while using the spirometer.  You develop fever of 100.5 F (38.1 C) or higher. SEEK IMMEDIATE MEDICAL CARE IF:   You cough up bloody sputum that had not been present before.  You develop fever of 102 F (38.9 C) or greater.  You develop worsening pain at or near the incision site. MAKE SURE YOU:   Understand these instructions.  Will watch your condition.  Will get  help right away if you are not doing well or get worse. Document Released: 08/31/2006 Document Revised: 07/13/2011 Document Reviewed: 11/01/2006 [facility] Patient Information 2014 [place], [place].   ________________________________________________________________________

## 2020-06-26 ENCOUNTER — Encounter (HOSPITAL_COMMUNITY)
Admission: RE | Admit: 2020-06-26 | Discharge: 2020-06-26 | Disposition: A | Discharge status: Home / Self Care | Payer: Medicaid Other | Source: Ambulatory Visit | Attending: Urology | Admitting: Urology

## 2020-06-26 ENCOUNTER — Encounter (HOSPITAL_COMMUNITY): Payer: Self-pay

## 2020-06-26 ENCOUNTER — Other Ambulatory Visit: Payer: Self-pay

## 2020-06-26 DIAGNOSIS — Z01812 Encounter for preprocedural laboratory examination: Secondary | ICD-10-CM | POA: Insufficient documentation

## 2020-06-26 HISTORY — DX: Anxiety disorder, unspecified: F41.9

## 2020-06-26 HISTORY — DX: Malignant (primary) neoplasm, unspecified: C80.1

## 2020-06-26 LAB — BASIC METABOLIC PANEL
Anion gap: 12 (ref 5–15)
BUN: 18 mg/dL (ref 8–23)
CO2: 22 mmol/L (ref 22–32)
Calcium: 9.4 mg/dL (ref 8.9–10.3)
Chloride: 99 mmol/L (ref 98–111)
Creatinine, Ser: 0.95 mg/dL (ref 0.61–1.24)
GFR, Estimated: >60 mL/min (ref >60)
Glucose, Bld: 120 mg/dL — ABNORMAL HIGH (ref 70–99)
Potassium: 3.6 mmol/L (ref 3.5–5.1)
Sodium: 133 mmol/L — ABNORMAL LOW (ref 135–145)

## 2020-06-26 LAB — CBC
HCT: 43.3 % (ref 39.0–52.0)
Hemoglobin: 14.6 g/dL (ref 13.0–17.0)
MCH: 31.3 pg (ref 26.0–34.0)
MCHC: 33.7 g/dL (ref 30.0–36.0)
MCV: 92.9 fL (ref 80.0–100.0)
Platelets: 269 10*3/uL (ref 150–400)
RBC: 4.66 MIL/uL (ref 4.22–5.81)
RDW: 11.9 % (ref 11.5–15.5)
WBC: 6.8 10*3/uL (ref 4.0–10.5)
nRBC: 0 % (ref 0.0–0.2)

## 2020-06-26 LAB — GLUCOSE, CAPILLARY: Glucose-Capillary: 135 mg/dL — ABNORMAL HIGH (ref 70–99)

## 2020-06-26 NOTE — Progress Notes (Signed)
COVID Vaccine Completed: NO Date COVID Vaccine completed: COVID vaccine manufacturer: [employer]   PCP - Dr. [name]. LOV: 06/06/20 Cardiologist -   Chest x-ray -  EKG - 05/30/20 Stress Test -  ECHO -  Cardiac Cath -  Pacemaker/ICD device last checked:  Sleep Study -  CPAP -   Fasting Blood Sugar - N/A Checks Blood Sugar __0___ times a day  Blood Thinner Instructions: Aspirin Instructions: Last Dose:  Anesthesia review: Hx: HTN,DIA  Patient denies shortness of breath, fever, cough and chest pain at PAT appointment   Patient verbalized understanding of instructions that were given to them at the PAT appointment. Patient was also instructed that they will need to review over the PAT instructions again at home before surgery.

## 2020-06-27 LAB — HEMOGLOBIN A1C
Hgb A1c MFr Bld: 6.6 % — ABNORMAL HIGH (ref 4.8–5.6)
Mean Plasma Glucose: 142.72 mg/dL

## 2020-07-02 ENCOUNTER — Ambulatory Visit (INDEPENDENT_AMBULATORY_CARE_PROVIDER_SITE_OTHER): Payer: Medicaid Other | Admitting: Podiatry

## 2020-07-02 ENCOUNTER — Other Ambulatory Visit: Payer: Self-pay

## 2020-07-02 ENCOUNTER — Other Ambulatory Visit (HOSPITAL_COMMUNITY)
Admission: RE | Admit: 2020-07-02 | Discharge: 2020-07-02 | Disposition: A | Discharge status: Home / Self Care | Payer: Medicaid Other | Source: Ambulatory Visit | Attending: Urology | Admitting: Urology

## 2020-07-02 ENCOUNTER — Encounter: Payer: Self-pay | Admitting: Podiatry

## 2020-07-02 DIAGNOSIS — B351 Tinea unguium: Secondary | ICD-10-CM

## 2020-07-02 DIAGNOSIS — Z20822 Contact with and (suspected) exposure to covid-19: Secondary | ICD-10-CM | POA: Insufficient documentation

## 2020-07-02 DIAGNOSIS — Z01812 Encounter for preprocedural laboratory examination: Secondary | ICD-10-CM | POA: Diagnosis not present

## 2020-07-02 DIAGNOSIS — M79674 Pain in right toe(s): Secondary | ICD-10-CM

## 2020-07-02 DIAGNOSIS — E1142 Type 2 diabetes mellitus with diabetic polyneuropathy: Secondary | ICD-10-CM

## 2020-07-02 DIAGNOSIS — M79675 Pain in left toe(s): Secondary | ICD-10-CM

## 2020-07-02 LAB — SARS CORONAVIRUS 2 (TAT 6-24 HRS): SARS Coronavirus 2: NEGATIVE

## 2020-07-02 NOTE — Progress Notes (Signed)
This patient presents the office for treatment of his long painful toenails both feet.  Patient states that he has back problems and he is unable to self treat his nails.  Patient states the he is diabetic..  He presents the office today for  nail care.  General Appearance  Alert, conversant and in no acute stress.  Vascular  Dorsalis pedis and posterior tibial  pulses are  weakly  palpable  bilaterally.  Capillary return is within normal limits  bilaterally. Temperature is within normal limits  bilaterally.  Neurologic  Senn-Weinstein monofilament wire test within normal limits  bilaterally. Muscle power within normal limits bilaterally.  Nails Thick disfigured discolored nails with subungual debris  from hallux to fifth toes bilaterally.  Orthopedic  No limitations of motion  feet .  No crepitus or effusions noted.  No bony pathology or digital deformities noted. Hammer toe 2-5  B/L.  Hallux malleus  B/L  Skin  normotropic skin with no porokeratosis noted bilaterally.  No signs of infections or ulcers noted.    Onychomycosis  X 10.  Diabetes  ROV.  Debride nails.  Discussed neuropathy with this patient RTC 3 months.    Spencer Walton DPM 

## 2020-07-04 MED ORDER — GENTAMICIN SULFATE 40 MG/ML IJ SOLN
5.0000 mg/kg | INTRAVENOUS | Status: AC
  Administered 2020-07-05: 400 mg via INTRAVENOUS
  Filled 2020-07-04: qty 10

## 2020-07-04 NOTE — Anesthesia Preprocedure Evaluation (Addendum)
Anesthesia Evaluation  Patient identified by MRN, date of birth, ID band Patient awake    Reviewed: Allergy & Precautions, NPO status , Patient's Chart, lab work & pertinent test results  History of Anesthesia Complications Negative for: history of anesthetic complications  Airway Mallampati: I  TM Distance: >3 FB Neck ROM: Full    Dental no notable dental hx. (+) Edentulous Upper, Dental Advisory Given,    Pulmonary neg pulmonary ROS,    Pulmonary exam normal        Cardiovascular hypertension, Pt. on medications Normal cardiovascular exam     Neuro/Psych PSYCHIATRIC DISORDERS Anxiety negative neurological ROS     GI/Hepatic   Endo/Other  diabetes, Type 2, Oral Hypoglycemic Agents  Renal/GU      Musculoskeletal   Abdominal   Peds  Hematology negative hematology ROS (+)   Anesthesia Other Findings   Reproductive/Obstetrics                            Anesthesia Physical  Anesthesia Plan  ASA: III  Anesthesia Plan: General   Post-op Pain Management:    Induction: Intravenous  PONV Risk Score and Plan: 4 or greater and Ondansetron, Dexamethasone, Midazolam and Scopolamine patch - Pre-op  Airway Management Planned: Oral ETT  Additional Equipment:   Intra-op Plan:   Post-operative Plan: Extubation in OR  Informed Consent: I have reviewed the patients History and Physical, chart, labs and discussed the procedure including the risks, benefits and alternatives for the proposed anesthesia with the patient or authorized representative who has indicated his/her understanding and acceptance.     Dental advisory given  Plan Discussed with: Anesthesiologist and CRNA  Anesthesia Plan Comments:        Anesthesia Quick Evaluation

## 2020-07-05 ENCOUNTER — Encounter (HOSPITAL_COMMUNITY)
Admission: RE | Disposition: A | Discharge status: Home / Self Care | Payer: Self-pay | Source: Home / Self Care | Attending: Urology

## 2020-07-05 ENCOUNTER — Other Ambulatory Visit: Payer: Self-pay

## 2020-07-05 ENCOUNTER — Observation Stay (HOSPITAL_COMMUNITY)
Admission: RE | Admit: 2020-07-05 | Discharge: 2020-07-06 | Disposition: A | Discharge status: Home / Self Care | Payer: Medicaid Other | Attending: Urology | Admitting: Urology

## 2020-07-05 ENCOUNTER — Ambulatory Visit (HOSPITAL_COMMUNITY): Payer: Medicaid Other | Admitting: Anesthesiology

## 2020-07-05 ENCOUNTER — Encounter (HOSPITAL_COMMUNITY): Payer: Self-pay | Admitting: Urology

## 2020-07-05 DIAGNOSIS — E119 Type 2 diabetes mellitus without complications: Secondary | ICD-10-CM | POA: Insufficient documentation

## 2020-07-05 DIAGNOSIS — I10 Essential (primary) hypertension: Secondary | ICD-10-CM | POA: Diagnosis not present

## 2020-07-05 DIAGNOSIS — Z79899 Other long term (current) drug therapy: Secondary | ICD-10-CM | POA: Insufficient documentation

## 2020-07-05 DIAGNOSIS — C61 Malignant neoplasm of prostate: Principal | ICD-10-CM | POA: Diagnosis present

## 2020-07-05 DIAGNOSIS — Z7984 Long term (current) use of oral hypoglycemic drugs: Secondary | ICD-10-CM | POA: Insufficient documentation

## 2020-07-05 HISTORY — PX: ROBOT ASSISTED LAPAROSCOPIC RADICAL PROSTATECTOMY: SHX5141

## 2020-07-05 HISTORY — PX: LYMPHADENECTOMY: SHX5960

## 2020-07-05 LAB — HEMOGLOBIN AND HEMATOCRIT, BLOOD
HCT: 35.6 % — ABNORMAL LOW (ref 39.0–52.0)
Hemoglobin: 11.6 g/dL — ABNORMAL LOW (ref 13.0–17.0)

## 2020-07-05 LAB — ABO/RH: ABO/RH(D): B POS

## 2020-07-05 LAB — TYPE AND SCREEN
ABO/RH(D): B POS
Antibody Screen: NEGATIVE

## 2020-07-05 LAB — GLUCOSE, CAPILLARY
Glucose-Capillary: 154 mg/dL — ABNORMAL HIGH (ref 70–99)
Glucose-Capillary: 167 mg/dL — ABNORMAL HIGH (ref 70–99)
Glucose-Capillary: 123 mg/dL — ABNORMAL HIGH (ref 70–99)

## 2020-07-05 SURGERY — PROSTATECTOMY, RADICAL, ROBOT-ASSISTED, LAPAROSCOPIC
Anesthesia: General

## 2020-07-05 MED ORDER — KETAMINE HCL 10 MG/ML IJ SOLN
INTRAMUSCULAR | Status: DC | PRN
  Administered 2020-07-05: 10 mg via INTRAVENOUS
  Administered 2020-07-05: 50 mg via INTRAVENOUS

## 2020-07-05 MED ORDER — GENTAMICIN IN SALINE 1-0.9 MG/ML-% IV SOLN
100.0000 mg | Freq: Once | INTRAVENOUS | Status: DC
  Filled 2020-07-05: qty 100

## 2020-07-05 MED ORDER — INSULIN ASPART 100 UNIT/ML ~~LOC~~ SOLN
0.0000 [IU] | Freq: Three times a day (TID) | SUBCUTANEOUS | Status: DC
  Administered 2020-07-05: 3 [IU] via SUBCUTANEOUS
  Administered 2020-07-06 (×2): 2 [IU] via SUBCUTANEOUS

## 2020-07-05 MED ORDER — ACETAMINOPHEN 500 MG PO TABS
1000.0000 mg | ORAL_TABLET | Freq: Once | ORAL | Status: AC
  Administered 2020-07-05: 1000 mg via ORAL
  Filled 2020-07-05: qty 2

## 2020-07-05 MED ORDER — ROCURONIUM BROMIDE 10 MG/ML (PF) SYRINGE
PREFILLED_SYRINGE | INTRAVENOUS | Status: AC
  Filled 2020-07-05: qty 10

## 2020-07-05 MED ORDER — ROCURONIUM BROMIDE 10 MG/ML (PF) SYRINGE
PREFILLED_SYRINGE | INTRAVENOUS | Status: AC
  Filled 2020-07-05: qty 20

## 2020-07-05 MED ORDER — FENTANYL CITRATE (PF) 250 MCG/5ML IJ SOLN
INTRAMUSCULAR | Status: AC
  Filled 2020-07-05: qty 5

## 2020-07-05 MED ORDER — FLEET ENEMA 7-19 GM/118ML RE ENEM: 1.0000 | ENEMA | Freq: Once | RECTAL | Status: DC

## 2020-07-05 MED ORDER — PROPOFOL 10 MG/ML IV BOLUS
INTRAVENOUS | Status: DC | PRN
  Administered 2020-07-05: 200 mg via INTRAVENOUS

## 2020-07-05 MED ORDER — STERILE WATER FOR IRRIGATION IR SOLN
Status: DC | PRN
  Administered 2020-07-05: 1000 mL

## 2020-07-05 MED ORDER — FENTANYL CITRATE (PF) 100 MCG/2ML IJ SOLN
INTRAMUSCULAR | Status: AC
  Filled 2020-07-05: qty 2

## 2020-07-05 MED ORDER — CITALOPRAM HYDROBROMIDE 20 MG PO TABS
10.0000 mg | ORAL_TABLET | Freq: Every day | ORAL | Status: DC
  Administered 2020-07-06: 10 mg via ORAL
  Filled 2020-07-05: qty 1

## 2020-07-05 MED ORDER — SUGAMMADEX SODIUM 200 MG/2ML IV SOLN
INTRAVENOUS | Status: DC | PRN
  Administered 2020-07-05: 200 mg via INTRAVENOUS

## 2020-07-05 MED ORDER — MAGNESIUM CITRATE PO SOLN: 1.0000 | Freq: Once | ORAL | Status: DC

## 2020-07-05 MED ORDER — ALBUMIN HUMAN 5 % IV SOLN: INTRAVENOUS | Status: DC | PRN

## 2020-07-05 MED ORDER — LIDOCAINE 2% (20 MG/ML) 5 ML SYRINGE
INTRAMUSCULAR | Status: AC
  Filled 2020-07-05: qty 5

## 2020-07-05 MED ORDER — CHLORHEXIDINE GLUCONATE CLOTH 2 % EX PADS: 6.0000 | MEDICATED_PAD | Freq: Every day | CUTANEOUS | Status: DC

## 2020-07-05 MED ORDER — ONDANSETRON HCL 4 MG/2ML IJ SOLN
INTRAMUSCULAR | Status: AC
  Filled 2020-07-05: qty 2

## 2020-07-05 MED ORDER — FENTANYL CITRATE (PF) 250 MCG/5ML IJ SOLN
INTRAMUSCULAR | Status: DC | PRN
  Administered 2020-07-05 (×4): 50 ug via INTRAVENOUS
  Administered 2020-07-05: 100 ug via INTRAVENOUS
  Administered 2020-07-05: 50 ug via INTRAVENOUS

## 2020-07-05 MED ORDER — HYDROCHLOROTHIAZIDE 25 MG PO TABS
25.0000 mg | ORAL_TABLET | Freq: Every day | ORAL | Status: DC
  Administered 2020-07-05 – 2020-07-06 (×2): 25 mg via ORAL
  Filled 2020-07-05 (×2): qty 1

## 2020-07-05 MED ORDER — GENTAMICIN SULFATE 40 MG/ML IJ SOLN
100.0000 mg | Freq: Once | INTRAVENOUS | Status: DC
  Filled 2020-07-05: qty 2.5

## 2020-07-05 MED ORDER — BACITRACIN-NEOMYCIN-POLYMYXIN 400-5-5000 EX OINT: 1.0000 "application " | TOPICAL_OINTMENT | Freq: Three times a day (TID) | CUTANEOUS | Status: DC | PRN

## 2020-07-05 MED ORDER — CELECOXIB 200 MG PO CAPS
200.0000 mg | ORAL_CAPSULE | Freq: Once | ORAL | Status: AC
  Administered 2020-07-05: 200 mg via ORAL
  Filled 2020-07-05: qty 1

## 2020-07-05 MED ORDER — LACTATED RINGERS IR SOLN
Status: DC | PRN
  Administered 2020-07-05: 1000 mL

## 2020-07-05 MED ORDER — ROCURONIUM BROMIDE 10 MG/ML (PF) SYRINGE
PREFILLED_SYRINGE | INTRAVENOUS | Status: DC | PRN
  Administered 2020-07-05: 20 mg via INTRAVENOUS
  Administered 2020-07-05: 100 mg via INTRAVENOUS
  Administered 2020-07-05: 30 mg via INTRAVENOUS
  Administered 2020-07-05: 10 mg via INTRAVENOUS
  Administered 2020-07-05 (×2): 20 mg via INTRAVENOUS
  Administered 2020-07-05: 30 mg via INTRAVENOUS

## 2020-07-05 MED ORDER — LACTATED RINGERS IV SOLN: INTRAVENOUS | Status: DC

## 2020-07-05 MED ORDER — INDIGOTINDISULFONATE SODIUM 8 MG/ML IJ SOLN
INTRAMUSCULAR | Status: AC
  Filled 2020-07-05: qty 5

## 2020-07-05 MED ORDER — MIDAZOLAM HCL 5 MG/5ML IJ SOLN
INTRAMUSCULAR | Status: DC | PRN
  Administered 2020-07-05: 2 mg via INTRAVENOUS

## 2020-07-05 MED ORDER — ORAL CARE MOUTH RINSE: 15.0000 mL | Freq: Once | OROMUCOSAL | Status: DC

## 2020-07-05 MED ORDER — PHENYLEPHRINE 40 MCG/ML (10ML) SYRINGE FOR IV PUSH (FOR BLOOD PRESSURE SUPPORT)
PREFILLED_SYRINGE | INTRAVENOUS | Status: AC
  Filled 2020-07-05: qty 10

## 2020-07-05 MED ORDER — DEXAMETHASONE SODIUM PHOSPHATE 10 MG/ML IJ SOLN
INTRAMUSCULAR | Status: AC
  Filled 2020-07-05: qty 1

## 2020-07-05 MED ORDER — KETAMINE HCL 10 MG/ML IJ SOLN
INTRAMUSCULAR | Status: AC
  Filled 2020-07-05: qty 1

## 2020-07-05 MED ORDER — AMISULPRIDE (ANTIEMETIC) 5 MG/2ML IV SOLN: 10.0000 mg | Freq: Once | INTRAVENOUS | Status: DC | PRN

## 2020-07-05 MED ORDER — DOCUSATE SODIUM 100 MG PO CAPS
100.0000 mg | ORAL_CAPSULE | Freq: Two times a day (BID) | ORAL | Status: DC
  Administered 2020-07-05 – 2020-07-06 (×2): 100 mg via ORAL
  Filled 2020-07-05 (×2): qty 1

## 2020-07-05 MED ORDER — EPHEDRINE 5 MG/ML INJ
INTRAVENOUS | Status: AC
  Filled 2020-07-05: qty 10

## 2020-07-05 MED ORDER — ESMOLOL HCL 100 MG/10ML IV SOLN
INTRAVENOUS | Status: DC | PRN
  Administered 2020-07-05 (×2): 30 mg via INTRAVENOUS

## 2020-07-05 MED ORDER — GABAPENTIN 100 MG PO CAPS
200.0000 mg | ORAL_CAPSULE | Freq: Every day | ORAL | Status: DC
  Administered 2020-07-05: 200 mg via ORAL
  Filled 2020-07-05: qty 2

## 2020-07-05 MED ORDER — HYDROMORPHONE HCL 1 MG/ML IJ SOLN
0.5000 mg | INTRAMUSCULAR | Status: DC | PRN
  Administered 2020-07-05 – 2020-07-06 (×2): 1 mg via INTRAVENOUS
  Filled 2020-07-05 (×2): qty 1

## 2020-07-05 MED ORDER — CITALOPRAM HYDROBROMIDE 20 MG PO TABS: 10.0000 mg | ORAL_TABLET | Freq: Every day | ORAL | Status: DC

## 2020-07-05 MED ORDER — FENTANYL CITRATE (PF) 100 MCG/2ML IJ SOLN
25.0000 ug | INTRAMUSCULAR | Status: DC | PRN
Start: 2020-07-05 — End: 2020-07-05
  Administered 2020-07-05 (×3): 50 ug via INTRAVENOUS

## 2020-07-05 MED ORDER — INDIGOTINDISULFONATE SODIUM 8 MG/ML IJ SOLN
INTRAMUSCULAR | Status: DC | PRN
  Administered 2020-07-05: 5 mL via INTRAVENOUS

## 2020-07-05 MED ORDER — SODIUM CHLORIDE (PF) 0.9 % IJ SOLN
INTRAMUSCULAR | Status: DC | PRN
  Administered 2020-07-05: 20 mL

## 2020-07-05 MED ORDER — METHYLENE BLUE 0.5 % INJ SOLN
INTRAVENOUS | Status: AC
  Filled 2020-07-05: qty 10

## 2020-07-05 MED ORDER — HYDROCODONE-ACETAMINOPHEN 5-325 MG PO TABS: 1.0000 | ORAL_TABLET | Freq: Four times a day (QID) | ORAL | 0 refills | Status: DC | PRN

## 2020-07-05 MED ORDER — BUPIVACAINE LIPOSOME 1.3 % IJ SUSP
20.0000 mL | Freq: Once | INTRAMUSCULAR | Status: AC
  Administered 2020-07-05: 20 mL
  Filled 2020-07-05: qty 20

## 2020-07-05 MED ORDER — OXYCODONE HCL 5 MG PO TABS
5.0000 mg | ORAL_TABLET | ORAL | Status: DC | PRN
  Administered 2020-07-05: 5 mg via ORAL
  Filled 2020-07-05 (×2): qty 1

## 2020-07-05 MED ORDER — ONDANSETRON HCL 4 MG/2ML IJ SOLN
INTRAMUSCULAR | Status: DC | PRN
  Administered 2020-07-05: 4 mg via INTRAVENOUS

## 2020-07-05 MED ORDER — SCOPOLAMINE 1 MG/3DAYS TD PT72
1.0000 | MEDICATED_PATCH | TRANSDERMAL | Status: DC
  Administered 2020-07-05: 1.5 mg via TRANSDERMAL
  Filled 2020-07-05: qty 1

## 2020-07-05 MED ORDER — DEXAMETHASONE SODIUM PHOSPHATE 10 MG/ML IJ SOLN
INTRAMUSCULAR | Status: DC | PRN
  Administered 2020-07-05: 10 mg via INTRAVENOUS

## 2020-07-05 MED ORDER — LACTATED RINGERS IV SOLN: INTRAVENOUS | Status: DC | PRN

## 2020-07-05 MED ORDER — SODIUM CHLORIDE 0.45 % IV SOLN: INTRAVENOUS | Status: DC

## 2020-07-05 MED ORDER — SODIUM CHLORIDE 0.9 % IV BOLUS
1000.0000 mL | Freq: Once | INTRAVENOUS | Status: AC
  Administered 2020-07-05: 1000 mL via INTRAVENOUS

## 2020-07-05 MED ORDER — ATORVASTATIN CALCIUM 40 MG PO TABS
60.0000 mg | ORAL_TABLET | Freq: Every day | ORAL | Status: DC
  Administered 2020-07-05 – 2020-07-06 (×2): 60 mg via ORAL
  Filled 2020-07-05 (×2): qty 2

## 2020-07-05 MED ORDER — LIDOCAINE 2% (20 MG/ML) 5 ML SYRINGE
INTRAMUSCULAR | Status: DC | PRN
  Administered 2020-07-05: 100 mg via INTRAVENOUS

## 2020-07-05 MED ORDER — SUCCINYLCHOLINE CHLORIDE 200 MG/10ML IV SOSY
PREFILLED_SYRINGE | INTRAVENOUS | Status: AC
  Filled 2020-07-05: qty 10

## 2020-07-05 MED ORDER — LABETALOL HCL 5 MG/ML IV SOLN
INTRAVENOUS | Status: DC | PRN
  Administered 2020-07-05 (×4): 5 mg via INTRAVENOUS

## 2020-07-05 MED ORDER — ACETAMINOPHEN 500 MG PO TABS
1000.0000 mg | ORAL_TABLET | Freq: Four times a day (QID) | ORAL | Status: AC
  Administered 2020-07-05 – 2020-07-06 (×4): 1000 mg via ORAL
  Filled 2020-07-05 (×4): qty 2

## 2020-07-05 MED ORDER — SULFAMETHOXAZOLE-TRIMETHOPRIM 800-160 MG PO TABS
1.0000 | ORAL_TABLET | Freq: Two times a day (BID) | ORAL | 0 refills | Status: DC
Start: 2020-07-05 — End: 2020-08-09

## 2020-07-05 MED ORDER — MIDAZOLAM HCL 2 MG/2ML IJ SOLN
INTRAMUSCULAR | Status: AC
  Filled 2020-07-05: qty 2

## 2020-07-05 MED ORDER — PROPOFOL 10 MG/ML IV BOLUS
INTRAVENOUS | Status: AC
  Filled 2020-07-05: qty 20

## 2020-07-05 MED ORDER — ONDANSETRON HCL 4 MG/2ML IJ SOLN: 4.0000 mg | INTRAMUSCULAR | Status: DC | PRN

## 2020-07-05 MED ORDER — DIPHENHYDRAMINE HCL 12.5 MG/5ML PO ELIX: 12.5000 mg | ORAL_SOLUTION | Freq: Four times a day (QID) | ORAL | Status: DC | PRN

## 2020-07-05 MED ORDER — SULFAMETHOXAZOLE-TRIMETHOPRIM 800-160 MG PO TABS: 1.0000 | ORAL_TABLET | Freq: Two times a day (BID) | ORAL | 0 refills | Status: DC

## 2020-07-05 MED ORDER — BELLADONNA ALKALOIDS-OPIUM 16.2-60 MG RE SUPP: 1.0000 | Freq: Four times a day (QID) | RECTAL | Status: DC | PRN

## 2020-07-05 MED ORDER — CHLORHEXIDINE GLUCONATE 0.12 % MT SOLN: 15.0000 mL | Freq: Once | OROMUCOSAL | Status: DC

## 2020-07-05 MED ORDER — SODIUM CHLORIDE (PF) 0.9 % IJ SOLN
INTRAMUSCULAR | Status: AC
  Filled 2020-07-05: qty 20

## 2020-07-05 MED ORDER — DIPHENHYDRAMINE HCL 50 MG/ML IJ SOLN: 12.5000 mg | Freq: Four times a day (QID) | INTRAMUSCULAR | Status: DC | PRN

## 2020-07-05 MED ORDER — LOSARTAN POTASSIUM 50 MG PO TABS
100.0000 mg | ORAL_TABLET | Freq: Every day | ORAL | Status: DC
  Administered 2020-07-05 – 2020-07-06 (×2): 100 mg via ORAL
  Filled 2020-07-05 (×2): qty 2

## 2020-07-05 MED ORDER — SCOPOLAMINE 1 MG/3DAYS TD PT72
MEDICATED_PATCH | TRANSDERMAL | Status: AC
  Filled 2020-07-05: qty 1

## 2020-07-05 SURGICAL SUPPLY — 74 items
APPLICATOR COTTON TIP 6 STRL (MISCELLANEOUS) ×2 IMPLANT
APPLICATOR COTTON TIP 6IN STRL (MISCELLANEOUS) ×3
APPLICATOR SURGIFLO ENDO (HEMOSTASIS) ×3 IMPLANT
CATH FOLEY 2WAY SLVR  5CC 18FR (CATHETERS) ×3
CATH FOLEY 2WAY SLVR 5CC 18FR (CATHETERS) ×2 IMPLANT
CATH ROBINSON RED A/P 16FR (CATHETERS) ×3 IMPLANT
CATH SILICONE 5CC 18FR (INSTRUMENTS) ×3 IMPLANT
CHLORAPREP W/TINT 26 (MISCELLANEOUS) ×3 IMPLANT
CLIP VESOLOCK LG 6/CT PURPLE (CLIP) ×6 IMPLANT
COVER SURGICAL LIGHT HANDLE (MISCELLANEOUS) ×3 IMPLANT
COVER TIP SHEARS 8 DVNC (MISCELLANEOUS) ×2 IMPLANT
COVER TIP SHEARS 8MM DA VINCI (MISCELLANEOUS) ×2
COVER WAND RF STERILE (DRAPES) IMPLANT
CUTTER ECHEON FLEX ENDO 45 340 (ENDOMECHANICALS) ×3 IMPLANT
DERMABOND ADVANCED (GAUZE/BANDAGES/DRESSINGS) ×1
DERMABOND ADVANCED .7 DNX12 (GAUZE/BANDAGES/DRESSINGS) ×2 IMPLANT
DRAIN CHANNEL RND F F (WOUND CARE) IMPLANT
DRAPE ARM DVNC X/XI (DISPOSABLE) ×8 IMPLANT
DRAPE COLUMN DVNC XI (DISPOSABLE) ×2 IMPLANT
DRAPE DA VINCI XI ARM (DISPOSABLE) ×4
DRAPE DA VINCI XI COLUMN (DISPOSABLE) ×2
DRAPE SURG IRRIG POUCH 19X23 (DRAPES) ×3 IMPLANT
DRSG TEGADERM 2-3/8X2-3/4 SM (GAUZE/BANDAGES/DRESSINGS) ×6 IMPLANT
DRSG TEGADERM 4X4.75 (GAUZE/BANDAGES/DRESSINGS) ×6 IMPLANT
DRSG TEGADERM 8X12 (GAUZE/BANDAGES/DRESSINGS) ×3 IMPLANT
ELECT PENCIL ROCKER SW 15FT (MISCELLANEOUS) ×3 IMPLANT
ELECT REM PT RETURN 15FT ADLT (MISCELLANEOUS) ×3 IMPLANT
GAUZE 4X4 16PLY RFD (DISPOSABLE) IMPLANT
GAUZE SPONGE 2X2 8PLY STRL LF (GAUZE/BANDAGES/DRESSINGS) IMPLANT
GLOVE SURG ENC MOIS LTX SZ6.5 (GLOVE) ×3 IMPLANT
GLOVE SURG ENC TEXT LTX SZ7 (GLOVE) ×6 IMPLANT
GLOVE SURG UNDER POLY LF SZ7.5 (GLOVE) ×6 IMPLANT
GOWN STRL REUS W/TWL LRG LVL3 (GOWN DISPOSABLE) ×15 IMPLANT
GOWN STRL REUS W/TWL XL LVL3 (GOWN DISPOSABLE) ×6 IMPLANT
HEMOSTAT POWDER SURGIFOAM 1G (HEMOSTASIS) IMPLANT
HOLDER FOLEY CATH W/STRAP (MISCELLANEOUS) ×3 IMPLANT
IRRIG SUCT STRYKERFLOW 2 WTIP (MISCELLANEOUS) ×3
IRRIGATION SUCT STRKRFLW 2 WTP (MISCELLANEOUS) ×2 IMPLANT
IV LACTATED RINGERS 1000ML (IV SOLUTION) ×3 IMPLANT
KIT TURNOVER KIT A (KITS) ×3 IMPLANT
MARKER SKIN DUAL TIP RULER LAB (MISCELLANEOUS) ×3 IMPLANT
NEEDLE INSUFFLATION 14GA 120MM (NEEDLE) ×3 IMPLANT
PACK ROBOT UROLOGY CUSTOM (CUSTOM PROCEDURE TRAY) ×3 IMPLANT
PROTECTOR NERVE ULNAR (MISCELLANEOUS) ×3 IMPLANT
SEAL CANN UNIV 5-8 DVNC XI (MISCELLANEOUS) ×12 IMPLANT
SEAL XI 5MM-8MM UNIVERSAL (MISCELLANEOUS) ×12
SET TUBE SMOKE EVAC HIGH FLOW (TUBING) ×3 IMPLANT
SOLUTION ELECTROLUBE (MISCELLANEOUS) ×3 IMPLANT
SPONGE GAUZE 2X2 STER 10/PKG (GAUZE/BANDAGES/DRESSINGS)
STAPLE RELOAD 45 GRN (STAPLE) ×2 IMPLANT
STAPLE RELOAD 45MM GREEN (STAPLE) ×2
SURGIFLO W/THROMBIN 8M KIT (HEMOSTASIS) ×3 IMPLANT
SUT ETHILON 2 0 PS N (SUTURE) ×3 IMPLANT
SUT MNCRL 3 0 VIOLET RB1 (SUTURE) IMPLANT
SUT MNCRL AB 4-0 PS2 18 (SUTURE) ×9 IMPLANT
SUT MONOCRYL 3 0 RB1 (SUTURE)
SUT PDS AB 0 CT1 36 (SUTURE) ×6 IMPLANT
SUT PROLENE 1 CT 1 30 (SUTURE) ×6 IMPLANT
SUT VIC AB 0 CT1 27 (SUTURE) ×3
SUT VIC AB 0 CT1 27XBRD ANTBC (SUTURE) ×6 IMPLANT
SUT VIC AB 2-0 CT1 27 (SUTURE) ×1
SUT VIC AB 2-0 CT1 27XBRD (SUTURE) ×2 IMPLANT
SUT VIC AB 2-0 SH 27 (SUTURE) ×2
SUT VIC AB 2-0 SH 27X BRD (SUTURE) ×2 IMPLANT
SUT VIC AB 2-0 SH 27XBRD (SUTURE) ×2 IMPLANT
SUT VIC AB 3-0 SH 27 (SUTURE)
SUT VIC AB 3-0 SH 27X BRD (SUTURE) IMPLANT
SUT VIC AB 4-0 RB1 27 (SUTURE)
SUT VIC AB 4-0 RB1 27XBRD (SUTURE) IMPLANT
SUT VLOC 3-0 9IN GRN (SUTURE) IMPLANT
SUT VLOC BARB 180 ABS3/0GR12 (SUTURE) ×12
SUTURE VLOC BRB 180 ABS3/0GR12 (SUTURE) ×8 IMPLANT
TOWEL OR NON WOVEN STRL DISP B (DISPOSABLE) IMPLANT
WATER STERILE IRR 1000ML POUR (IV SOLUTION) ×3 IMPLANT

## 2020-07-05 NOTE — Discharge Instructions (Signed)

## 2020-07-05 NOTE — Progress Notes (Signed)
Pt called out saying he had blurry vision. "My eyesight is a little bad anyway, this seems blurry though." Pt VS 181/97, HR 106, 100%, RR 20. Pt presents as anxious, legs shaking. Bilateral grips, no drift in arms bilerally. CBG 159. This RN took [employer] patch off (will replace if dr approves). Pt is alert and oriented.
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
## 2020-07-05 NOTE — Anesthesia Postprocedure Evaluation (Signed)
Anesthesia Post Note  Patient: Spencer Walton.  Procedure(s) Performed: XI ROBOTIC ASSISTED LAPAROSCOPIC RADICAL PROSTATECTOMY (N/A ) LYMPHADENECTOMY, PELVIC (Bilateral )     Patient location during evaluation: PACU Anesthesia Type: General Level of consciousness: sedated Pain management: pain level controlled Vital Signs Assessment: post-procedure vital signs reviewed and stable Respiratory status: spontaneous breathing and respiratory function stable Cardiovascular status: stable Postop Assessment: no apparent nausea or vomiting Anesthetic complications: no   No complications documented.  Last Vitals:  Vitals:   07/05/20 1500 07/05/20 1503  BP: (!) 169/86 (!) 169/86  Pulse: 96 100  Resp: 17 15  Temp:    SpO2: 97% 98%                 SINGER,JAMES DANIEL

## 2020-07-05 NOTE — Transfer of Care (Signed)
Immediate Anesthesia Transfer of Care Note  Patient: Spencer Walton.  Procedure(s) Performed: XI ROBOTIC ASSISTED LAPAROSCOPIC RADICAL PROSTATECTOMY (N/A ) LYMPHADENECTOMY, PELVIC (Bilateral )  Patient Location: PACU  Anesthesia Type:General  Level of Consciousness: awake, alert  and oriented  Airway & Oxygen Therapy: Patient Spontanous Breathing and Patient connected to face mask  Post-op Assessment: Report given to RN and Post -op Vital signs reviewed and stable  Post vital signs: Reviewed and stable  Last Vitals:  Vitals Value Taken Time  BP 168/78 07/05/20 1408  Temp    Pulse 98 07/05/20 1411  Resp 24 07/05/20 1411  SpO2 99 % 07/05/20 1411  Vitals shown include unvalidated device data.  Last Pain:  Vitals:   07/05/20 0534  TempSrc: Oral         Complications: No complications documented.

## 2020-07-05 NOTE — Op Note (Signed)
Operative Note  Preoperative diagnosis:  1.  Localized prostate cancer  Postoperative diagnosis: 1.  Localized prostate cancer  Procedure(s): 1.  Robotic assisted laparoscopic radical prostatectomy  2.  Robotic assisted laparoscopic bilateral pelvic lymph node dissection  Surgeon: Rexene Alberts, MD  Assistants:  None  Anesthesia:  General  Complications:  None  EBL:  315ml  Specimens: 1.  Prostate with seminal vesicles 2.  Periprostatic fat 3. Bilateral pelvic lymph nodes ID Type Source Tests Collected by Time Destination  1 : periprostatic fat Tissue Fatty Tissue SURGICAL PATHOLOGY Janith Lima, MD 07/05/2020 [phone]   2 : Right pelvic lymph node  Tissue PATH Lymph node excision SURGICAL PATHOLOGY Janith Lima, MD 07/05/2020 1116   3 : Left pelvic lymph node  Tissue PATH Lymph node excision SURGICAL PATHOLOGY Janith Lima, MD 07/05/2020 1121   4 : Prostate Tissue PATH Male Tumor SURGICAL PATHOLOGY Janith Lima, MD 07/05/2020 1310     Drains/Catheters: 1.  18 French Foley catheter  Intraoperative findings:   1. Approximately 40 gram prostate.  No accessory pudendal vessels. Large abdomen and very narrow pelvis with a lot of perivesicular fat. Excellent hemostasis. Difficult anastomosis given narrow pelvis that required placing additional working ports superior and medial on the abdomen.  Water-tight anastomosis without leak.  Indication:  Spencer Walton. is a 65 y.o. male who initially presented with an elevated PSA.  Prostate biopsy showed Gleason 3+4 = 7 prostate cancer involving bilateral prostate.  Treatment options were discussed with him at length and he chose robotic assisted laparoscopic radical prostatectomy.  He has erectile dysfunction pre-operatively and the plan is for non-nerve sparing.  Bilateral pelvic lymph node dissection was planned due to his risk stratification.  Description of procedure: The indications, alternatives, benefits, and risks were discussed  with the patient and informed symptoms obtained.  The patient was brought to the operating room table, positioned supine and secured to the bed with a safety strap.  All pressure points were carefully padded and pneumatic compression devices were placed on lower extremities.  After the ministration of intravenous antibiotics and general endotracheal anesthesia, the patient was repositioned in the dorsal lithotomy position using well-padded Allen stirrups.  The arms were carefully tucked at the patient's side and secured with padding.  The chest was secured in place with foam padding and cloth tape and the table was positioned in approximately 30 degree Trendelenburg.  A rectal exam showed the prostate to be 40cc, without nodules and not fixed. The patient's abdomen, genitalia, and upper thighs were prepped and draped in the standard sterile manner.  A time was completed, verifying the correct patient, surgical procedure and positioning prior to beginning the procedure.  An 51 French urethral catheter was inserted to drain the bladder.  Pneumoperitoneum was introduced by placing a Veress needle into the abdomen at Palmar's point and insufflated with CO2 to a pressure of 15 mmHg. An 8 mm blunt tip trocar was placed just below the umbilicus which was measured at 17cm. This was placed here as he had a prior umbilical hernia repair with mesh. We did come just through the inferior aspect of the mesh.  The 0 degree camera was then passed under direct visualization.  The abdominal cavity was examined for any sign of injury, adhesions, and identification of anatomic landmarks.  The remainder of the trochars were placed which included 2 separate 8 millimeter robotic trochars which were placed 9 cm laterally and inferiorly to the initially placed  camera trocar.  A 12 mm trocar was placed 8 mm lateral to the right robotic trocar.  A separate 8 mm robotic trocar was placed 8 mm lateral to the previously placed left robotic  trocar on the left side.  A 5 mm trocar was placed to the right and well above the umbilicus which approximately 10 and 12 cm away from the right-sided trochars.  The robot was then docked.  Placed monopolar scissors in the right hand, a fenestrated bipolar in the left hand and a prograsp in the fourth arm.  The urachus and median umbilical ligament was divided and we developed the space of Retzius down to the pubic bone.  I divided the parietal peritoneum laterally up to the vas deferens on each side. He did have some evidence of prior bilateral inguinal hernia repairs. There was no recurrent hernia. He was found to have a large abdominal working space, but extremely narrow pelvis with very tight working space. Use the prograsp forcep to provide cranial traction on the urachus.  The prostate was then defatted above the prostatic vesicle junction, and the superficial dorsal venous complex was coagulated with bipolar and divided.  We submitted the periprostatic fat for pathological specimen.  The endopelvic fascia was sharply opened bilaterally and the levator muscle fibers were swept posterior laterally allowing for visualization of the deep dorsal venous complex and apex of the prostate.  The puboprostatic ligaments were sharply divided with care taken to preserve the dorsal venous complex.  I secured the dorsal venous complex with an endovascular stapler.  I then addressed the bladder neck. I identified the bladder neck by pulling a Foley catheter.  The fourth arm was applying cranial traction on the urachus.  I divided the anterior bladder neck musculature until I found the anterior bladder neck mucosa which was then incised. I identified the Foley catheter within, the balloon was deflated, we pulled the Foley catheter out into the operating field.  The assistant then used a grasper to apply traction to the Foley catheter and the surgical tech then placed a Eyota on the catheter near the penis for optimal  retraction.  I then divided the lateral bladder neck mucosa in the posterior bladder neck mucosa.  I was well away from the bilateral ureteral orifices.  I divided the posterior bladder neck musculature until I found the vas deferens. There was a small cystostomy made on the posterior aspect of the bladder which was closed with 2-0 vicryl sutures. We did give indigo carmine which confirmed we were well away from the ureters.   The bilateral vas deferens were then freed and divided.  I then freed the bilateral seminal vesicles using blunt and sharp dissection.   I divided the Denonvilliers fascia beneath the prostate and developed the prostate off the rectum.  This plane was bluntly developed towards the apex of the prostate.  I then addressed the pedicles, first starting with the right and then moving towards the left. I then did a non-nerve spare by dividing the lateral pelvic fascia off of the prostatic capsule laterally.  I then isolated the pedicles of the prostate and placed Weck clips on the pedicles and then divided the pedicles with cold scissors.  I continue to divide the neurovascular bundles off of the prostate out to the apex of the prostate.  At this point the prostate was essentially freed up except for the urethra.   I then addressed the prostate anteriorly. The anterior urethra was then sharply divided  with cold scissors.  The Foley catheter was then pulled back and we divided the posterior urethral wall. The specimen was then placed in a Endo Catch bag and then the bag was placed in the upper abdomen out of the way.  I then irrigated the pelvis.  We performed a rectal test by instilling air into the rectal Foley.  The test was negative.  There is no concern for rectal injury.   I then performed a bilateral pelvic lymph node dissection by incising the fascia overlying the right external iliac vein, dissecting distally.  I went just distal to the node of Cloquet were replaced clips and then  divided the lymphatics.  The lateral aspect of the dissection was the pelvic sidewall, inferior was the obturator nerve and proximal of the hypogastric vessels.  I placed clips at the proximal aspect and then divide lymphatics.  The specimen was removed with the scope grasper and sent to pathology.  Grossly, there were no enlarged lymph nodes.  This was performed on both the right and left pelvic lymph nodes.  With good hemostasis confirmed, I then did the posterior reconstruction with a Rocco stitch.  I used a 3-0 Vloc on an RB1 needle.  I passed the sutures through the cut edges of Denonvilliers fascia beneath the bladder on the right side and through the posterior serrated sphincter underneath the urethra.  I ran this from right to left.  Of note, the anastomosis was difficult given the very tight working space. I had to place two separate 42mm robotic trocars superior and medial on his abdomen just to be able to utilize the needle drivers underneath the prominent pubic symphysis.  I then completed the urethrovesical anastomosis using a 3-0 Vloc suture on an RB1 needle.  I passed both ends of the suture from the outside in through the bladder neck at the 6 o'clock position.  I passed both through the urethral stump from the inside out and the corresponding position.  I reapproximated the bladder neck to the urethra.  I then ran the left suture on the left side anastomosis to the 9 o'clock position.  And then went back to the right-sided suture around that up the right side of the 12 o'clock position.  I then continued the left suture to the 12 o'clock position.   I then placed a new 18 French Foley catheter into the bladder and filled it with 10 cc of sterile water.  I then secured the knot and then passed the suture behind the pubic bone for anterior suspension.  The bladder was irrigated with 200 cc of water.  There was no leak.  Hemostasis was excellent.  A 15 French drain was placed through the  previously placed fourth arm.  We did place a Carter-Thomason 0 vicryl suture through the 12 mm assistant port. The robot was undocked and all the trochars were removed under direct vision.    I enlarged the umbilical trocar site large enough to remove the prostate and closed the fascia with 0 Prolene as we had to cut through some of his mesh. All the port sites were irrigated.  Exparel was injected to the trocar sites.  The skin was closed with 4-0 Monocryl in a running subcuticular fashion.  Skin glue was applied.  At this point, the patient was extubated and awakened in the operating room and taken to recovery room in stable condition.  There were no immediate complications.  All counts were correct.  Plan: Admit  for observation overnight.  Clear liquids tonight.  Regular for breakfast tomorrow.  Anticipate discharge home tomorrow.  [facility]  Pager: [phone]

## 2020-07-05 NOTE — Anesthesia Procedure Notes (Signed)
Procedure Name: Intubation Performed by: Monti Villers A, CRNA Pre-anesthesia Checklist: Patient identified, Emergency Drugs available, Suction available and Patient being monitored Patient Re-evaluated:Patient Re-evaluated prior to induction Oxygen Delivery Method: Circle system utilized Preoxygenation: Pre-oxygenation with 100% oxygen Induction Type: IV induction Ventilation: Mask ventilation without difficulty Laryngoscope Size: Miller and 2 Grade View: Grade I Tube type: Oral Number of attempts: 1 Airway Equipment and Method: Stylet Placement Confirmation: ETT inserted through vocal cords under direct vision,  positive ETCO2 and breath sounds checked- equal and bilateral Secured at: 22 cm Tube secured with: Tape Dental Injury: Teeth and Oropharynx as per pre-operative assessment        

## 2020-07-06 ENCOUNTER — Encounter (HOSPITAL_COMMUNITY): Payer: Self-pay | Admitting: Urology

## 2020-07-06 DIAGNOSIS — C61 Malignant neoplasm of prostate: Secondary | ICD-10-CM | POA: Diagnosis not present

## 2020-07-06 LAB — GLUCOSE, CAPILLARY
Glucose-Capillary: 121 mg/dL — ABNORMAL HIGH (ref 70–99)
Glucose-Capillary: 127 mg/dL — ABNORMAL HIGH (ref 70–99)

## 2020-07-06 LAB — BASIC METABOLIC PANEL
Anion gap: 6 (ref 5–15)
BUN: 17 mg/dL (ref 8–23)
CO2: 28 mmol/L (ref 22–32)
Calcium: 8.3 mg/dL — ABNORMAL LOW (ref 8.9–10.3)
Chloride: 103 mmol/L (ref 98–111)
Creatinine, Ser: 1.04 mg/dL (ref 0.61–1.24)
GFR, Estimated: >60 mL/min (ref >60)
Glucose, Bld: 108 mg/dL — ABNORMAL HIGH (ref 70–99)
Potassium: 3.9 mmol/L (ref 3.5–5.1)
Sodium: 137 mmol/L (ref 135–145)

## 2020-07-06 LAB — HEMOGLOBIN AND HEMATOCRIT, BLOOD
HCT: 33.4 % — ABNORMAL LOW (ref 39.0–52.0)
Hemoglobin: 10.9 g/dL — ABNORMAL LOW (ref 13.0–17.0)

## 2020-07-06 MED ORDER — BISACODYL 10 MG RE SUPP
10.0000 mg | Freq: Once | RECTAL | Status: AC
  Administered 2020-07-06: 10 mg via RECTAL
  Filled 2020-07-06: qty 1

## 2020-07-06 NOTE — Plan of Care (Signed)
  Problem: Bowel/Gastric: Goal: Gastrointestinal status for postoperative course will improve Outcome: Progressing   Problem: Pain Management: Goal: General experience of comfort will improve Outcome: Progressing   Problem: Urinary Elimination: Goal: Ability to avoid or minimize complications of infection will improve Outcome: Progressing   Problem: Activity: Goal: Risk for activity intolerance will decrease Outcome: Progressing   Problem: Pain Managment: Goal: General experience of comfort will improve Outcome: Progressing   Problem: Safety: Goal: Ability to remain free from injury will improve Outcome: Progressing

## 2020-07-06 NOTE — Progress Notes (Signed)
Patient ID: Spencer Walton., male   DOB: 1956/02/16, 65 y.o.   MRN: [id number]  1 Day Post-Op Subjective: The patient is doing well.  No nausea or vomiting. Pain is adequately controlled.  Objective: Vital signs in last 24 hours: Temp:  [97.7 F (36.5 C)-98.7 F (37.1 C)] 98.6 F (37 C) (03/05 0556) Pulse Rate:  [85-107] 85 (03/05 0556) Resp:  [15-23] 16 (03/05 0556) BP: (116-169)/(77-108) 134/80 (03/05 0556) SpO2:  [96 %-100 %] 99 % (03/05 0556) Weight:  [92.1 kg] 92.1 kg (03/04 1706)  Intake/Output from previous day: 03/04 0701 - 03/05 0700 In: 5176 [I.V.:3543; IV Piggyback:500] Out: 1607 [Urine:3775; Drains:95; Blood:280] Intake/Output this shift: Total I/O In: -  Out: 1030 [Urine:1000; Drains:30]  Physical Exam:  General: Alert and oriented. CV: RRR Lungs: Clear bilaterally. GI: Soft, Nondistended. Incisions: Clean, dry, and intact Urine: Clear Extremities: Nontender, no erythema, no edema.  Lab Results: Recent Labs    07/05/20 1440 07/06/20 0436  HGB 11.6* 10.9*  HCT 35.6* 33.4*      Assessment/Plan: POD# 1 s/p robotic prostatectomy.  1) SL IVF 2) Ambulate, Incentive spirometry 3) Transition to oral pain medication 4) Dulcolax suppository 5) D/C pelvic drain 6) Plan for likely discharge later today   Pryor Curia. MD   LOS: 0 days   Dutch Gray 07/06/2020, 12:13 PM

## 2020-07-06 NOTE — Discharge Summary (Signed)
Date of admission: 07/05/2020  Date of discharge: 07/06/2020  Admission diagnosis: Prostate Cancer  Discharge diagnosis: Prostate Cancer  History and Physical: For full details, please see admission history and physical. Briefly, Spencer Hogen. is a 65 y.o. gentleman with localized prostate cancer.  After discussing management/treatment options, he elected to proceed with surgical treatment.  Hospital Course: Spencer Walton. was taken to the operating room on 07/05/2020 and underwent a robotic assisted laparoscopic radical prostatectomy. He tolerated this procedure well and without complications. Postoperatively, he was able to be transferred to a regular hospital room following recovery from anesthesia.  He was able to begin ambulating the night of surgery. He remained hemodynamically stable overnight.  He had excellent urine output with appropriately minimal output from his pelvic drain and his pelvic drain was removed on POD #1.  He was transitioned to oral pain medication, tolerated a clear liquid diet, and had met all discharge criteria and was able to be discharged home later on POD#1.  Laboratory values: Recent Labs    07/05/20 1440 07/06/20 0436  HGB 11.6* 10.9*  HCT 35.6* 33.4*    Disposition: Home  Discharge instruction: He was instructed to be ambulatory but to refrain from heavy lifting, strenuous activity, or driving. He was instructed on urethral catheter care.  Discharge medications:   Allergies as of 07/06/2020      Reactions   Cherry Extract Anaphylaxis, Hives, Swelling, Rash   Fruit & Vegetable Daily [nutritional Supplements] Anaphylaxis, Hives, Swelling, Rash   Fruit cocktail: Lips swell   Other Anaphylaxis, Swelling   NO NUTS!!!!   Peach [prunus Persica] Anaphylaxis, Swelling   Pecan Nut (diagnostic) Shortness Of Breath, Swelling   Throat and tongue swelling   Penicillins Anaphylaxis, Swelling   Has patient had a PCN reaction causing immediate rash,  facial/tongue/throat swelling, SOB or lightheadedness with hypotension: Yes Has patient had a PCN reaction causing severe rash involving mucus membranes or skin necrosis: No Has patient had a PCN reaction that required hospitalization: No Has patient had a PCN reaction occurring within the last 10 years: No If all of the above answers are "NO", then may proceed with Cephalosporin use.   Pineapple Anaphylaxis, Hives, Swelling   Lisinopril Cough      Medication List    STOP taking these medications   ibuprofen 800 MG tablet Commonly known as: ADVIL     TAKE these medications   atorvastatin 40 MG tablet Commonly known as: LIPITOR 1.5 tabs daily with evening meal What changed:   how much to take  how to take this  when to take this   citalopram 10 MG tablet Commonly known as: CeleXA Take 1 tablet (10 mg total) by mouth daily.   diazepam 5 MG tablet Commonly known as: VALIUM Take as directed prior to procedure.   gabapentin 100 MG capsule Commonly known as: NEURONTIN 1 cap by mouth at bedtime daily to increase to 2 caps in 3 days and 3 caps in 6 days as tolerated. What changed:   how much to take  how to take this  when to take this   hydrochlorothiazide 25 MG tablet Commonly known as: HYDRODIURIL 1 tab by mouth daily in morning with Losartan What changed:   how much to take  how to take this  when to take this   HYDROcodone-acetaminophen 5-325 MG tablet Commonly known as: Norco Take 1-2 tablets by mouth every 6 (six) hours as needed for moderate pain.   losartan 50 MG tablet  Commonly known as: COZAAR 2 tabs by mouth once daily What changed:   how much to take  how to take this  when to take this   metFORMIN 500 MG tablet Commonly known as: GLUCOPHAGE Take 1 tablet (500 mg total) by mouth 2 (two) times daily with a meal.   sulfamethoxazole-trimethoprim 800-160 MG tablet Commonly known as: BACTRIM DS Take 1 tablet by mouth 2 (two) times daily.  Start the day prior to foley removal appointment       Followup: He will followup in 1 week for catheter removal and to discuss his surgical pathology results.

## 2020-07-10 ENCOUNTER — Other Ambulatory Visit: Payer: Self-pay | Admitting: Internal Medicine

## 2020-07-12 LAB — SURGICAL PATHOLOGY

## 2020-07-31 ENCOUNTER — Ambulatory Visit: Payer: Medicaid Other | Attending: Urology | Admitting: Physical Therapy

## 2020-08-08 ENCOUNTER — Encounter: Payer: Medicaid Other | Admitting: Internal Medicine

## 2020-08-09 ENCOUNTER — Ambulatory Visit (INDEPENDENT_AMBULATORY_CARE_PROVIDER_SITE_OTHER): Payer: Medicaid Other | Admitting: Internal Medicine

## 2020-08-09 ENCOUNTER — Other Ambulatory Visit: Payer: Self-pay

## 2020-08-09 ENCOUNTER — Encounter: Payer: Self-pay | Admitting: Internal Medicine

## 2020-08-09 ENCOUNTER — Other Ambulatory Visit: Payer: Self-pay | Admitting: Orthopaedic Surgery

## 2020-08-09 VITALS — BP 137/89 | HR 100 | Resp 12 | Ht 69.5 in | Wt 200.0 lb

## 2020-08-09 DIAGNOSIS — Z0000 Encounter for general adult medical examination without abnormal findings: Secondary | ICD-10-CM | POA: Diagnosis not present

## 2020-08-09 DIAGNOSIS — I10 Essential (primary) hypertension: Secondary | ICD-10-CM

## 2020-08-09 DIAGNOSIS — E782 Mixed hyperlipidemia: Secondary | ICD-10-CM | POA: Diagnosis not present

## 2020-08-09 DIAGNOSIS — E118 Type 2 diabetes mellitus with unspecified complications: Secondary | ICD-10-CM

## 2020-08-09 DIAGNOSIS — C801 Malignant (primary) neoplasm, unspecified: Secondary | ICD-10-CM

## 2020-08-09 DIAGNOSIS — E785 Hyperlipidemia, unspecified: Secondary | ICD-10-CM | POA: Insufficient documentation

## 2020-08-09 MED ORDER — GABAPENTIN 100 MG PO CAPS: ORAL_CAPSULE | ORAL | 3 refills | Status: DC

## 2020-08-09 MED ORDER — CITALOPRAM HYDROBROMIDE 10 MG PO TABS: 10.0000 mg | ORAL_TABLET | Freq: Every day | ORAL | 3 refills | Status: DC
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
## 2020-08-09 NOTE — Patient Instructions (Signed)
Gabapentin: Start taking Gabapentin 100 mg cap 1 cap at bedtime. In 3 days, increase to 2 caps at bedtime. In another 3 days, increase to 3 caps at bedtime You should be taking 3 caps at bedtime at this point.  In 3 days, start another 1 cap in the morning In 3 more days, increase to 2 caps in the morning In 3 more days, increase to 3 caps in the morning:  You should be taking 3 caps in the morning and 3 caps at bedtime at this point.  In 3 days, start another dose midday--1 cap In 3 more days, increase to 2 caps midday In 3 more days, increase to 3 caps midday. You should be taking 3 caps 3 times daily at this point.  Stay on this dose until follow up If you do not tolerate increasing the dose at any point, hold on the dose you tolerate or call clinic if having problems 

## 2020-08-09 NOTE — Telephone Encounter (Signed)
Please advise 

## 2020-08-09 NOTE — Telephone Encounter (Signed)
He had recent prostate surgery, he will have to check with his surgeon to see if he can restart it.

## 2020-08-12 ENCOUNTER — Other Ambulatory Visit: Payer: Medicaid Other | Admitting: Internal Medicine

## 2020-08-12 ENCOUNTER — Other Ambulatory Visit: Payer: Self-pay

## 2020-08-26 ENCOUNTER — Other Ambulatory Visit (INDEPENDENT_AMBULATORY_CARE_PROVIDER_SITE_OTHER): Payer: Medicaid Other | Admitting: Internal Medicine

## 2020-08-26 ENCOUNTER — Other Ambulatory Visit: Payer: Self-pay

## 2020-08-26 DIAGNOSIS — Z79899 Other long term (current) drug therapy: Secondary | ICD-10-CM

## 2020-08-26 DIAGNOSIS — E118 Type 2 diabetes mellitus with unspecified complications: Secondary | ICD-10-CM | POA: Diagnosis not present

## 2020-08-26 DIAGNOSIS — E782 Mixed hyperlipidemia: Secondary | ICD-10-CM

## 2020-08-27 LAB — CBC WITH DIFFERENTIAL/PLATELET
Basophils Absolute: 0.1 10*3/uL (ref 0.0–0.2)
Basos: 1 %
EOS (ABSOLUTE): 0.4 10*3/uL (ref 0.0–0.4)
Eos: 6 %
Hematocrit: 42.8 % (ref 37.5–51.0)
Hemoglobin: 13.8 g/dL (ref 13.0–17.7)
Immature Grans (Abs): 0 10*3/uL (ref 0.0–0.1)
Immature Granulocytes: 0 %
Lymphocytes Absolute: 2.7 10*3/uL (ref 0.7–3.1)
Lymphs: 40 %
MCH: 30.2 pg (ref 26.6–33.0)
MCHC: 32.2 g/dL (ref 31.5–35.7)
MCV: 94 fL (ref 79–97)
Monocytes Absolute: 0.5 10*3/uL (ref 0.1–0.9)
Monocytes: 7 %
Neutrophils Absolute: 3.2 10*3/uL (ref 1.4–7.0)
Neutrophils: 46 %
Platelets: 285 10*3/uL (ref 150–450)
RBC: 4.57 x10E6/uL (ref 4.14–5.80)
RDW: 12 % (ref 11.6–15.4)
WBC: 6.8 10*3/uL (ref 3.4–10.8)

## 2020-08-27 LAB — COMPREHENSIVE METABOLIC PANEL
ALT: 21 IU/L (ref 0–44)
AST: 19 IU/L (ref 0–40)
Albumin/Globulin Ratio: 2.2 (ref 1.2–2.2)
Albumin: 5.2 g/dL — ABNORMAL HIGH (ref 3.8–4.8)
Alkaline Phosphatase: 70 IU/L (ref 44–121)
BUN/Creatinine Ratio: 17 (ref 10–24)
BUN: 17 mg/dL (ref 8–27)
Bilirubin Total: 0.6 mg/dL (ref 0.0–1.2)
CO2: 23 mmol/L (ref 20–29)
Calcium: 9.9 mg/dL (ref 8.6–10.2)
Chloride: 98 mmol/L (ref 96–106)
Creatinine, Ser: 1.03 mg/dL (ref 0.76–1.27)
Globulin, Total: 2.4 g/dL (ref 1.5–4.5)
Glucose: 118 mg/dL — ABNORMAL HIGH (ref 65–99)
Potassium: 4.1 mmol/L (ref 3.5–5.2)
Sodium: 139 mmol/L (ref 134–144)
Total Protein: 7.6 g/dL (ref 6.0–8.5)
eGFR: 81 mL/min/{1.73_m2} (ref >59)

## 2020-08-27 LAB — LIPID PANEL W/O CHOL/HDL RATIO
Cholesterol, Total: 130 mg/dL (ref 100–199)
HDL: 46 mg/dL (ref >39)
LDL Chol Calc (NIH): 70 mg/dL (ref 0–99)
Triglycerides: 69 mg/dL (ref 0–149)
VLDL Cholesterol Cal: 14 mg/dL (ref 5–40)

## 2020-08-27 LAB — HGB A1C W/O EAG: Hgb A1c MFr Bld: 6.8 % — ABNORMAL HIGH (ref 4.8–5.6)

## 2020-10-02 ENCOUNTER — Ambulatory Visit (INDEPENDENT_AMBULATORY_CARE_PROVIDER_SITE_OTHER): Payer: Medicaid Other | Admitting: Podiatry

## 2020-10-02 ENCOUNTER — Ambulatory Visit: Payer: Medicaid Other | Admitting: Physical Therapy

## 2020-10-02 ENCOUNTER — Encounter: Payer: Self-pay | Admitting: Podiatry

## 2020-10-02 ENCOUNTER — Other Ambulatory Visit: Payer: Self-pay

## 2020-10-02 DIAGNOSIS — M79675 Pain in left toe(s): Secondary | ICD-10-CM

## 2020-10-02 DIAGNOSIS — M79674 Pain in right toe(s): Secondary | ICD-10-CM | POA: Diagnosis not present

## 2020-10-02 DIAGNOSIS — E1142 Type 2 diabetes mellitus with diabetic polyneuropathy: Secondary | ICD-10-CM

## 2020-10-02 DIAGNOSIS — B351 Tinea unguium: Secondary | ICD-10-CM

## 2020-10-02 NOTE — Progress Notes (Signed)
This patient presents the office for treatment of his long painful toenails both feet.  Patient states that he has back problems and he is unable to self treat his nails.  Patient states the he is diabetic..  He presents the office today for  nail care.  General Appearance  Alert, conversant and in no acute stress.  Vascular  Dorsalis pedis and posterior tibial  pulses are  weakly  palpable  bilaterally.  Capillary return is within normal limits  bilaterally. Temperature is within normal limits  bilaterally.  Neurologic  Senn-Weinstein monofilament wire test within normal limits  bilaterally. Muscle power within normal limits bilaterally.  Nails Thick disfigured discolored nails with subungual debris  from hallux to fifth toes bilaterally.  Orthopedic  No limitations of motion  feet .  No crepitus or effusions noted.  No bony pathology or digital deformities noted. Hammer toe 2-5  B/L.  Hallux malleus  B/L  Skin  normotropic skin with no porokeratosis noted bilaterally.  No signs of infections or ulcers noted.    Onychomycosis  X 10.  Diabetes  ROV.  Debride nails.  Discussed neuropathy with this patient RTC 3 months.    Elody Kleinsasser DPM 

## 2020-10-04 ENCOUNTER — Other Ambulatory Visit: Payer: Self-pay

## 2020-10-04 ENCOUNTER — Encounter: Payer: Self-pay | Admitting: Internal Medicine

## 2020-10-04 ENCOUNTER — Ambulatory Visit (INDEPENDENT_AMBULATORY_CARE_PROVIDER_SITE_OTHER): Payer: Medicaid Other | Admitting: Internal Medicine

## 2020-10-04 VITALS — BP 160/74 | HR 108 | Resp 24 | Ht 69.5 in | Wt 209.0 lb

## 2020-10-04 DIAGNOSIS — L853 Xerosis cutis: Secondary | ICD-10-CM

## 2020-10-04 DIAGNOSIS — E118 Type 2 diabetes mellitus with unspecified complications: Secondary | ICD-10-CM | POA: Diagnosis not present

## 2020-10-04 DIAGNOSIS — M25561 Pain in right knee: Secondary | ICD-10-CM | POA: Diagnosis not present

## 2020-10-04 DIAGNOSIS — T148XXA Other injury of unspecified body region, initial encounter: Secondary | ICD-10-CM

## 2020-10-04 DIAGNOSIS — E782 Mixed hyperlipidemia: Secondary | ICD-10-CM

## 2020-10-04 DIAGNOSIS — G8929 Other chronic pain: Secondary | ICD-10-CM

## 2020-10-04 DIAGNOSIS — M25562 Pain in left knee: Secondary | ICD-10-CM

## 2020-10-04 MED ORDER — EPINEPHRINE 0.3 MG/0.3ML IJ SOAJ: 0.3000 mg | INTRAMUSCULAR | 1 refills | Status: DC | PRN

## 2020-10-04 MED ORDER — DICLOFENAC SODIUM 1 % EX GEL: CUTANEOUS | 6 refills | Status: DC

## 2020-10-04 NOTE — Progress Notes (Signed)
Subjective:    Patient ID: Spencer Walton., male   DOB: 26-Mar-1956, 65 y.o.   MRN: [id number]   HPI   1.  Bilateral knees started aching really badly last week.  No problem when up walking around, but bad after lying or sitting for a time. Also with a bruise on his right anterior thigh.   He admits to always overdoing lawn care that exacerbates this.   Was taking gabapentin for his feet, but seemed to make his pain worse.    2.  Hypertension:  He is in pain today.  3.  DM/hyperlipidemia:  Discussed his control with medication is fine with both of these.  Current Meds  Medication Sig  . atorvastatin (LIPITOR) 40 MG tablet 1.5 tabs daily with evening meal  . citalopram (CELEXA) 10 MG tablet Take 1 tablet (10 mg total) by mouth daily.  Marland Kitchen EPINEPHrine (EPIPEN 2-PAK) 0.3 mg/0.3 mL IJ SOAJ injection Inject 0.3 mg into the muscle as needed for anaphylaxis.  . hydrochlorothiazide (HYDRODIURIL) 25 MG tablet 1 tab by mouth daily in morning with Losartan  . ibuprofen (ADVIL) 800 MG tablet Take 800 mg by mouth every 8 (eight) hours as needed.  Marland Kitchen losartan (COZAAR) 50 MG tablet 2 tabs by mouth once daily  . metFORMIN (GLUCOPHAGE) 500 MG tablet TAKE 1 TABLET BY MOUTH TWICE DAILY WITH A MEAL   Allergies  Allergen Reactions  . Cherry Extract Anaphylaxis, Hives, Swelling and Rash  . Fruit & Vegetable Daily [Nutritional Supplements] Anaphylaxis, Hives, Swelling and Rash    Fruit cocktail: Lips swell  . Other Anaphylaxis and Swelling    NO NUTS!!!!  . Peach [Prunus Persica] Anaphylaxis and Swelling  . Pecan Nut (Diagnostic) Shortness Of Breath and Swelling    Throat and tongue swelling  . Penicillins Anaphylaxis and Swelling    Has patient had a PCN reaction causing immediate rash, facial/tongue/throat swelling, SOB or lightheadedness with hypotension: Yes Has patient had a PCN reaction causing severe rash involving mucus membranes or skin necrosis: No Has patient had a PCN reaction that  required hospitalization: No Has patient had a PCN reaction occurring within the last 10 years: No If all of the above answers are "NO", then may proceed with Cephalosporin use.   Marland Kitchen Pineapple Anaphylaxis, Hives and Swelling  . Lisinopril Cough     Review of Systems    Objective:   BP (!) 160/74 (BP Location: Right Arm, Patient Position: Sitting, Cuff Size: Normal)   Pulse (!) 108   Resp (!) 24   Ht 5' 9.5" (1.765 m)   Wt 209 lb (94.8 kg)   BMI 30.42 kg/m   Physical Exam  NAD Skin:  Very dry, particularly on anterior thighs and pretibial areas bilaterally.  Very faint darkened oval on anterior thigh.  No swelling.  Under a very dry scaly area of skin.  Denies scratching. Bilateral knees:  No effusion or swelling.  No erythema.  Full ROM.  No tenderness or laxity with stress of cruciates or collateral ligaments.  NT over medial and lateral joint lines and popliteal fossae. Mild tenderness with compression of patella against anterior knee.   Assessment & Plan  1.  ?bruise:  Not clear if this is a bruise.  If it is, very old and resolving.    2.  Dry skin:  Cooler showers.  Dove soap.  Eucerin cream for eczema relief all over after shower.  3.  Knee pain:  Unable to find significant problem  on exam today.  Diclofenac gel 2-4 g to knees up to twice daily--mainly at bedtime.  Not to use the Ibuprofen at same time/day.  4.   DM/Hyperlipidemia:  Controlled.  Rest of labs fine as well.

## 2020-10-04 NOTE — Patient Instructions (Addendum)
Recommend obtaining Shingrix for shingles vaccine.  Eucerin Cream for eczema relief Please do not take really hot showers--pat dry afterward and apply eucerin cream all over right away.

## 2020-10-30 ENCOUNTER — Encounter: Payer: Self-pay | Admitting: Internal Medicine

## 2020-11-14 ENCOUNTER — Other Ambulatory Visit: Payer: Self-pay | Admitting: Internal Medicine

## 2020-11-24 ENCOUNTER — Other Ambulatory Visit: Payer: Self-pay | Admitting: Internal Medicine

## 2020-11-29 ENCOUNTER — Other Ambulatory Visit: Payer: Self-pay

## 2020-11-29 DIAGNOSIS — E782 Mixed hyperlipidemia: Secondary | ICD-10-CM

## 2020-11-29 MED ORDER — ATORVASTATIN CALCIUM 40 MG PO TABS: ORAL_TABLET | ORAL | 10 refills | Status: DC

## 2020-12-31 ENCOUNTER — Other Ambulatory Visit: Payer: Self-pay

## 2020-12-31 ENCOUNTER — Encounter: Payer: Self-pay | Admitting: Internal Medicine

## 2020-12-31 ENCOUNTER — Ambulatory Visit (INDEPENDENT_AMBULATORY_CARE_PROVIDER_SITE_OTHER): Payer: Medicaid Other | Admitting: Internal Medicine

## 2020-12-31 VITALS — BP 102/80 | HR 84 | Resp 20 | Ht 69.5 in | Wt 212.0 lb

## 2020-12-31 DIAGNOSIS — E782 Mixed hyperlipidemia: Secondary | ICD-10-CM | POA: Diagnosis not present

## 2020-12-31 DIAGNOSIS — G8929 Other chronic pain: Secondary | ICD-10-CM

## 2020-12-31 DIAGNOSIS — M25561 Pain in right knee: Secondary | ICD-10-CM

## 2020-12-31 DIAGNOSIS — E118 Type 2 diabetes mellitus with unspecified complications: Secondary | ICD-10-CM

## 2020-12-31 DIAGNOSIS — I10 Essential (primary) hypertension: Secondary | ICD-10-CM | POA: Diagnosis not present

## 2020-12-31 DIAGNOSIS — M25562 Pain in left knee: Secondary | ICD-10-CM

## 2020-12-31 LAB — GLUCOSE, POCT (MANUAL RESULT ENTRY): POC Glucose: 167 mg/dl — AB (ref 70–99)

## 2020-12-31 NOTE — Patient Instructions (Signed)
[facility] [address] [place], [place], [place]  [address]  [phone]

## 2020-12-31 NOTE — Progress Notes (Signed)
Subjective:    Patient ID: Spencer Walton., male   DOB: Feb 02, 1956, 65 y.o.   MRN: [id number]   HPI   Bilateral Knee pain/low back pain and left shoulder pain:  All of this is keeping him up at night.  Using Diclofenac gel 3-4 times daily, but just cannot sleep at night due to the pain.  He has never had xrays of knees.  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    2.  DM:  has been well controlled.  Has not eyes checked in past year.  Was referred to Dr. Midge Aver.  He did not set the appt up when they called to set up appt.    3.  Hyperlipidemia:  Controlled with lipids at goal in April.   4.  Occasional urinary incontinence following radical prostatectomy.  He has not followed up with PT following surgery.   5.  HM:  Okay with shingles vaccine, though clinic out currently.  Has not been vaccinated for COVID.  Refuses to consider COVID vaccination.  6.  Anxiety/insomnia:  stopped taking Citalopram.  Does not feel he needs it.  Discussed he seemed much better with anxiety and depression after starting, but he does not see it.    Allergies  Allergen Reactions   Cherry Extract Anaphylaxis, Hives, Swelling and Rash   Fruit & Vegetable Daily [Nutritional Supplements] Anaphylaxis, Hives, Swelling and Rash    Fruit cocktail: Lips swell   Other Anaphylaxis and Swelling    NO NUTS!!!!   Peach [Prunus Persica] Anaphylaxis and Swelling   Pecan Nut (Diagnostic) Shortness Of Breath and Swelling    Throat and tongue swelling   Penicillins Anaphylaxis and Swelling    Has patient had a PCN reaction causing immediate rash, facial/tongue/throat swelling, SOB or lightheadedness with hypotension: Yes Has patient had a PCN reaction causing severe rash involving mucus membranes or skin necrosis: No Has patient had a PCN reaction that required hospitalization: No Has patient had a PCN reaction occurring within the last 10 years: No If all of the above answers are "NO", then  may proceed with Cephalosporin use.    Pineapple Anaphylaxis, Hives and Swelling   Lisinopril Cough     Review of Systems    Objective:   BP 102/80 (BP Location: Left Arm, Patient Position: Sitting, Cuff Size: Normal)   Pulse 84   Resp 20   Ht 5' 9.5" (1.765 m)   Wt 212 lb (96.2 kg)   BMI 30.86 kg/m   Physical Exam NAD HEENT:  PERRL, EOMI,  Neck:  Supple, no adenopathy Chest:  CTA CV:  RRR without murmur or rub.  Radial and DP pulses normal and equal Abd:  protuberant.  S, NT, No HSM or mass, + BS LE:  No edema.  Well developed musculature.  Knees without hypertrophic changes and full ROM.  No effusion.  Perhaps mild tenderness along medial joint margins.  No laxity or tenderness with stress maneuvers of cruciates or collaterals.     Assessment & Plan    Chronic bilateral knee pain:  Continue diclofenac topically.  Xray of both knees and consider corticosteroid injections.  Consider ortho for low back after treating knees.  2.  DM:  Has been controlled.  A1C.  Eye referral again.  He states he will make the appt this time.  Followed by podiatry.  3.  Hypertension:  controlled  4.  Hyperlipidemia:  Controlled  5.  HM:  he  would like Shingrix--will call when available again.

## 2021-01-01 ENCOUNTER — Ambulatory Visit: Payer: Medicaid Other | Attending: Urology | Admitting: Physical Therapy

## 2021-01-01 LAB — HGB A1C W/O EAG: Hgb A1c MFr Bld: 6.6 % — ABNORMAL HIGH (ref 4.8–5.6)

## 2021-01-03 ENCOUNTER — Other Ambulatory Visit: Payer: Self-pay

## 2021-01-03 ENCOUNTER — Ambulatory Visit (INDEPENDENT_AMBULATORY_CARE_PROVIDER_SITE_OTHER): Payer: Medicaid Other | Admitting: Podiatry

## 2021-01-03 ENCOUNTER — Encounter: Payer: Self-pay | Admitting: Podiatry

## 2021-01-03 DIAGNOSIS — M79674 Pain in right toe(s): Secondary | ICD-10-CM

## 2021-01-03 DIAGNOSIS — B351 Tinea unguium: Secondary | ICD-10-CM | POA: Diagnosis not present

## 2021-01-03 DIAGNOSIS — E1142 Type 2 diabetes mellitus with diabetic polyneuropathy: Secondary | ICD-10-CM

## 2021-01-03 DIAGNOSIS — M79675 Pain in left toe(s): Secondary | ICD-10-CM

## 2021-01-03 NOTE — Progress Notes (Signed)
This patient presents the office for treatment of his long painful toenails both feet.  Patient states that he has back problems and he is unable to self treat his nails.  Patient states the he is diabetic..  He presents the office today for  nail care.  General Appearance  Alert, conversant and in no acute stress.  Vascular  Dorsalis pedis and posterior tibial  pulses are  weakly  palpable  bilaterally.  Capillary return is within normal limits  bilaterally. Temperature is within normal limits  bilaterally.  Neurologic  Senn-Weinstein monofilament wire test within normal limits  bilaterally. Muscle power within normal limits bilaterally.  Nails Thick disfigured discolored nails with subungual debris  from hallux to fifth toes bilaterally.  Orthopedic  No limitations of motion  feet .  No crepitus or effusions noted.  No bony pathology or digital deformities noted. Hammer toe 2-5  B/L.  Hallux malleus  B/L  Skin  normotropic skin with no porokeratosis noted bilaterally.  No signs of infections or ulcers noted.    Onychomycosis  X 10.  Diabetes  ROV.  Debride nails.  Discussed neuropathy with this patient RTC 3 months.    Shaquina Gillham DPM 

## 2021-01-10 ENCOUNTER — Other Ambulatory Visit: Payer: Self-pay | Admitting: Internal Medicine

## 2021-04-07 ENCOUNTER — Ambulatory Visit: Payer: Medicaid Other | Admitting: Podiatry

## 2021-04-14 ENCOUNTER — Ambulatory Visit: Payer: Medicaid Other | Admitting: Podiatry

## 2021-04-14 ENCOUNTER — Other Ambulatory Visit: Payer: Self-pay

## 2021-04-14 ENCOUNTER — Encounter: Payer: Self-pay | Admitting: Podiatry

## 2021-04-14 DIAGNOSIS — E1142 Type 2 diabetes mellitus with diabetic polyneuropathy: Secondary | ICD-10-CM | POA: Diagnosis not present

## 2021-04-14 DIAGNOSIS — B351 Tinea unguium: Secondary | ICD-10-CM | POA: Diagnosis not present

## 2021-04-14 DIAGNOSIS — M79674 Pain in right toe(s): Secondary | ICD-10-CM

## 2021-04-14 DIAGNOSIS — M79675 Pain in left toe(s): Secondary | ICD-10-CM | POA: Diagnosis not present

## 2021-04-14 NOTE — Progress Notes (Signed)
This patient presents the office for treatment of his long painful toenails both feet.  Patient states that he has back problems and he is unable to self treat his nails.  Patient states the he is diabetic..  He presents the office today for  nail care.  General Appearance  Alert, conversant and in no acute stress.  Vascular  Dorsalis pedis and posterior tibial  pulses are  weakly  palpable  bilaterally.  Capillary return is within normal limits  bilaterally. Temperature is within normal limits  bilaterally.  Neurologic  Senn-Weinstein monofilament wire test within normal limits  bilaterally. Muscle power within normal limits bilaterally.  Nails Thick disfigured discolored nails with subungual debris  from hallux to fifth toes bilaterally.  Orthopedic  No limitations of motion  feet .  No crepitus or effusions noted.  No bony pathology or digital deformities noted. Hammer toe 2-5  B/L.  Hallux malleus  B/L  Skin  normotropic skin with no porokeratosis noted bilaterally.  No signs of infections or ulcers noted.    Onychomycosis  X 10.  Diabetes  ROV.  Debride nails.  Discussed neuropathy with this patient RTC 3 months.    Kellene Mccleary DPM 

## 2021-06-16 ENCOUNTER — Encounter (HOSPITAL_COMMUNITY): Payer: Self-pay

## 2021-06-16 ENCOUNTER — Ambulatory Visit (HOSPITAL_COMMUNITY)
Admission: EM | Admit: 2021-06-16 | Discharge: 2021-06-16 | Disposition: A | Discharge status: Home / Self Care | Payer: Medicaid Other | Attending: Emergency Medicine | Admitting: Emergency Medicine

## 2021-06-16 ENCOUNTER — Other Ambulatory Visit: Payer: Self-pay

## 2021-06-16 DIAGNOSIS — R059 Cough, unspecified: Secondary | ICD-10-CM | POA: Diagnosis not present

## 2021-06-16 DIAGNOSIS — U071 COVID-19: Secondary | ICD-10-CM | POA: Diagnosis not present

## 2021-06-16 DIAGNOSIS — J069 Acute upper respiratory infection, unspecified: Secondary | ICD-10-CM | POA: Insufficient documentation

## 2021-06-16 LAB — POCT RAPID STREP A, ED / UC: Streptococcus, Group A Screen (Direct): NEGATIVE

## 2021-06-16 MED ORDER — LIDOCAINE VISCOUS HCL 2 % MT SOLN: 15.0000 mL | OROMUCOSAL | 0 refills | Status: DC | PRN

## 2021-06-16 MED ORDER — BACLOFEN 10 MG PO TABS: 5.0000 mg | ORAL_TABLET | Freq: Two times a day (BID) | ORAL | 0 refills | Status: DC

## 2021-06-16 MED ORDER — IBUPROFEN 800 MG PO TABS: 800.0000 mg | ORAL_TABLET | Freq: Three times a day (TID) | ORAL | 0 refills | Status: DC

## 2021-06-16 NOTE — ED Provider Notes (Signed)
[facility]    CSN: [id number] Arrival date & time: 06/16/21  1426      History   Chief Complaint Chief Complaint  Patient presents with   Cough    Congestion    HPI Spencer Walton. is a 66 y.o. male.   Patient presents with fever, nasal congestion, rhinorrhea, sore throat, nonproductive cough, body aches, generalized headache and a decreased appetite for 3 days.  Known sick contact in household.  Has attempted use of cough drops and ibuprofen which has not been effective.  Patient is not vaccinated.  History of diabetes mellitus, hypertension, hyperlipidemia.,  Cancer.    Past Medical History:  Diagnosis Date   ACE-inhibitor cough 2016   Anxiety    Arthritis    Cancer ([place])    prostate; Gleason score 7.  Confined to prostate.  Lymph nodes negative.   Diabetes mellitus without complication ([place]) 56/3149   A1C was 10.3%   Elevated PSA, less than 10 ng/ml 01/22/2020   Free PSA 0.53   Food allergy, peanut 07/21/2018   Also pecan   Hyperlipidemia    Hypertension 11/2014   Left inguinal hernia    Low back pain    History of back injury as a teenager in [place].  States he had a fractured vertebrae.  Had ORIF.   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) 07/04/2019   Pain due to onychomycosis of toenails of both feet 03/29/2019   Food allergy, peanut 07/21/2018   Low back pain    Diabetes mellitus without complication ([place]) 88/50/2774   Essential hypertension 11/02/2014   Microalbuminuria 11/02/2014   Hypertension 11/2014    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/04/2014    Past Surgical History:  Procedure Laterality Date   Banding of internal hemorrhoids  1996   Extensive hand surgery  1977   pinning of fracture 4th Metacarpal fracture   Foreign body removal right hand  2006   HERNIA REPAIR Right 1999   x 3   INGUINAL HERNIA REPAIR Left 10/18/2018   Procedure: LEFT INGUINAL HERNIA REPAIR WITH MESH;  Surgeon: Donnie Mesa, MD;  Location: [place];  Service: General;  Laterality: Left;   LYMPHADENECTOMY Bilateral 07/05/2020   Procedure: LYMPHADENECTOMY, PELVIC;  Surgeon: Janith Lima, MD;  [facility];  Service: Urology;  Laterality: Bilateral;   NOSE SURGERY  1998   ORIF vertebral fracture  1975   ROBOT ASSISTED LAPAROSCOPIC RADICAL PROSTATECTOMY N/A 07/05/2020   Procedure: XI ROBOTIC ASSISTED LAPAROSCOPIC RADICAL PROSTATECTOMY;  Surgeon: Janith Lima, MD;  [facility];  Service: Urology;  Laterality: N/A;  ONLY NEEDS 240 MIN FOR ALL PROCEDURES   UMBILICAL HERNIA REPAIR N/A 10/18/2018   Procedure: UMBILICAL HERNIA REPAIR WITH MESH;  Surgeon: Donnie Mesa, MD;  Location: [place];  Service: General;  Laterality: N/A;       Home Medications    Prior to Admission medications   Medication Sig Start Date End Date Taking? Authorizing Provider  atorvastatin (LIPITOR) 40 MG tablet TAKE 1 & 1/2 (ONE & ONE-HALF) TABLETS  BY MOUTH ONCE DAILY WITH EVENING MEAL 11/29/20   [name], MD  citalopram (CELEXA) 10 MG tablet Take 1 tablet (10 mg total) by mouth daily. Patient not taking: Reported on 12/31/2020 08/09/20   [name], MD  diclofenac Sodium (VOLTAREN) 1 % GEL Apply 2 to 4 g to affected areas up to twice daily. 10/04/20   [name], MD  EPINEPHrine (EPIPEN 2-PAK) 0.3 mg/0.3 mL IJ SOAJ injection Inject 0.3 mg into the muscle as needed for anaphylaxis. 10/04/20   [name], MD  hydrochlorothiazide (HYDRODIURIL) 25 MG tablet TAKE 1 TABLET BY MOUTH IN  THE MORNING WITH LOSARTAN 01/16/21   [name], MD  ibuprofen (ADVIL) 800 MG tablet Take 800 mg by mouth every 8 (eight) hours as needed.    [provider]  losartan (COZAAR) 50 MG tablet Take 2 tablets by mouth once daily 11/16/20   [name], MD  metFORMIN (GLUCOPHAGE) 500 MG tablet TAKE 1 TABLET BY MOUTH TWICE DAILY WITH A MEAL 07/15/20   [name], MD    Family History Family History  Problem Relation Age of Onset   Cancer Mother        colon cancer   Hypertension Mother    Colon cancer Mother 20   Heart disease Father        MI was cause of death during a seizure   Seizures Father    Cancer Sister        Lung--smoker   Seizures Sister    Colon polyps Sister    Multiple sclerosis Brother    Heart disease Brother        history of MI   Gallstones Daughter    Heart disease Brother 61       MI   Esophageal cancer Neg Hx    Stomach cancer Neg Hx    Rectal cancer Neg Hx     Social History Social History   Tobacco Use   Smoking status: Never   Smokeless tobacco: Never  Vaping Use   Vaping Use: Never used  Substance Use Topics   Alcohol use: Not Currently    Comment: None since beginning of March 2022 following prostatectomy.   Drug use: Never     Allergies   Cherry extract, Fruit & vegetable daily [nutritional supplements], Other, Peach [prunus persica], Pecan nut (diagnostic), Penicillins, Pineapple, and Lisinopril   Review of Systems Review of Systems  Constitutional:  Positive for appetite change and fever. Negative for activity change, chills, diaphoresis, fatigue and unexpected weight change.  HENT:  Positive for congestion, rhinorrhea and sore throat. Negative for dental problem, drooling, ear discharge, ear pain, facial swelling, hearing loss, mouth sores, nosebleeds, postnasal drip, sinus pressure, sinus pain, sneezing, tinnitus, trouble swallowing and voice change.   Respiratory:  Positive for cough. Negative for apnea,  choking, chest tightness, shortness of breath, wheezing and stridor.   Cardiovascular: Negative.   Gastrointestinal: Negative.   Musculoskeletal:  Positive for myalgias. Negative for arthralgias, back pain, gait problem, joint swelling, neck pain and neck stiffness.  Skin: Negative.   Neurological:  Positive for headaches. Negative for dizziness, tremors, seizures, syncope, facial asymmetry, speech difficulty, weakness, light-headedness and numbness.    Physical Exam Triage Vital Signs ED Triage Vitals [06/16/21 1557]  Enc Vitals Group     BP 127/73     Pulse Rate 83     Resp 16     Temp 99.4 F (37.4 C)     Temp Source Oral  SpO2 100 %     Weight      Height      Head Circumference      Peak Flow      Pain Score      Pain Loc      Pain Edu?      Excl. in [place]?    No data found.  Updated Vital Signs BP 127/73 (BP Location: Left Arm)    Pulse 83    Temp 99.4 F (37.4 C) (Oral)    Resp 16    SpO2 100%   Visual Acuity Right Eye Distance:   Left Eye Distance:   Bilateral Distance:    Right Eye Near:   Left Eye Near:    Bilateral Near:     Physical Exam Constitutional:      Appearance: Normal appearance.  HENT:     Head: Normocephalic.     Right Ear: Tympanic membrane, ear canal and external ear normal.     Left Ear: Tympanic membrane, ear canal and external ear normal.     Nose: Congestion present.     Mouth/Throat:     Mouth: Mucous membranes are moist.     Pharynx: Posterior oropharyngeal erythema present.  Eyes:     Extraocular Movements: Extraocular movements intact.  Cardiovascular:     Rate and Rhythm: Normal rate and regular rhythm.     Pulses: Normal pulses.     Heart sounds: Normal heart sounds.  Pulmonary:     Effort: Pulmonary effort is normal.     Breath sounds: Normal breath sounds.  Musculoskeletal:     Cervical back: Normal range of motion and neck supple.  Skin:    General: Skin is warm and dry.  Neurological:     General: No focal  deficit present.     Mental Status: He is alert and oriented to person, place, and time. Mental status is at baseline.  Psychiatric:        Mood and Affect: Mood normal.        Behavior: Behavior normal.     UC Treatments / Results  Labs (all labs ordered are listed, but only abnormal results are displayed) Labs Reviewed - No data to display  EKG   Radiology No results found.  Procedures Procedures (including critical care time)  Medications Ordered in UC Medications - No data to display  Initial Impression / Assessment and Plan / UC Course  I have reviewed the triage vital signs and the nursing notes.  Pertinent labs & imaging results that were available during my care of the patient were reviewed by me and considered in my medical decision making (see chart for details).  Viral URI with cough  Rapid strep negative, sent for culture, COVID test pending, discussed use of antivirals if positive, patient in agreement to use, vital signs are stable with a low-grade fever noted in exam, O2 saturation 100% and lungs are clear to auscultation, will defer imaging at this time, etiology of symptoms is most likely viral, prescribed lidocaine viscous, ibuprofen 800 mg and baclofen as sore throat and body aches are most worrisome symptoms today, may attempt use of over-the-counter medication for further supportive care, urgent care follow-up as needed  Final Clinical Impressions(s) / UC Diagnoses   Final diagnoses:  None   Discharge Instructions   None    ED Prescriptions   None    PDMP not reviewed this encounter.   Hans Eden, NP 06/16/21 1650

## 2021-06-16 NOTE — Discharge Instructions (Addendum)
Your symptoms today are most likely being caused by a virus and should steadily improve in time it can take up to 7 to 10 days before you truly start to see a turnaround however things will get better  Strep test negative  COVID test pending, you will be notified if positive, if positive you will qualify for antivirals due to your history of diabetes, you will take medicine twice a day for the next 5 days  You will gargle and spit lidocaine solution every 4 hours as needed for temporary relief of your sore throat  You may use muscle relaxer twice a day to help with your body aches, be mindful this medication make you drowsy    You can take Tylenol and/or Ibuprofen as needed for fever reduction and pain relief.   For cough: honey 1/2 to 1 teaspoon (you can dilute the honey in water or another fluid).  You can also use guaifenesin and dextromethorphan for cough. You can use a humidifier for chest congestion and cough.  If you don't have a humidifier, you can sit in the bathroom with the hot shower running.      For sore throat: try warm salt water gargles, cepacol lozenges, throat spray, warm tea or water with lemon/honey, popsicles or ice, or OTC cold relief medicine for throat discomfort.   For congestion: take a daily anti-histamine like Zyrtec, Claritin, and a oral decongestant, such as pseudoephedrine.  You can also use Flonase 1-2 sprays in each nostril daily.   It is important to stay hydrated: drink plenty of fluids (water, gatorade/powerade/pedialyte, juices, or teas) to keep your throat moisturized and help further relieve irritation/discomfort.

## 2021-06-16 NOTE — ED Triage Notes (Signed)
Pt presents to urgent care for cough,congestion, runny nose and fatigue.  Symptoms started 3 days ago.

## 2021-06-17 LAB — SARS CORONAVIRUS 2 (TAT 6-24 HRS): SARS Coronavirus 2: POSITIVE — AB

## 2021-06-19 LAB — CULTURE, GROUP A STREP (THRC)

## 2021-06-25 ENCOUNTER — Other Ambulatory Visit: Payer: Self-pay | Admitting: Internal Medicine

## 2021-07-07 ENCOUNTER — Encounter (HOSPITAL_COMMUNITY): Payer: Self-pay

## 2021-07-07 ENCOUNTER — Other Ambulatory Visit: Payer: Self-pay

## 2021-07-07 ENCOUNTER — Ambulatory Visit (HOSPITAL_COMMUNITY)
Admission: EM | Admit: 2021-07-07 | Discharge: 2021-07-07 | Disposition: A | Discharge status: Home / Self Care | Payer: Medicare Other | Attending: Family Medicine | Admitting: Family Medicine

## 2021-07-07 ENCOUNTER — Ambulatory Visit (INDEPENDENT_AMBULATORY_CARE_PROVIDER_SITE_OTHER): Payer: Medicare Other

## 2021-07-07 DIAGNOSIS — R059 Cough, unspecified: Secondary | ICD-10-CM

## 2021-07-07 DIAGNOSIS — J069 Acute upper respiratory infection, unspecified: Secondary | ICD-10-CM | POA: Diagnosis not present

## 2021-07-07 MED ORDER — BENZONATATE 100 MG PO CAPS: 100.0000 mg | ORAL_CAPSULE | Freq: Three times a day (TID) | ORAL | 0 refills | Status: DC | PRN

## 2021-07-07 NOTE — ED Triage Notes (Signed)
Pt reports having covid 3 weeks ago and states he has been coughing ever since. States it has not improved and is concerned of Pneumonia.  ? ?State he has body aches and runny nose.  ?

## 2021-07-07 NOTE — ED Provider Notes (Signed)
[place]    CSN: [id number] Arrival date & time: 07/07/21  1745      History   Chief Complaint Chief Complaint  Patient presents with   Cough    HPI Spencer Walton. is a 66 y.o. male.    Cough Here for persistent cough.  On February 13 he tested positive for COVID here.  He had been sick for about a week before he came in, so no antivirals were prescribed.  His sore throat is better.  He is still producing some clear rhinorrhea and he is coughing up clear mucus also.  No recent fever.  No vomiting or diarrhea and he is eating okay  Past Medical History:  Diagnosis Date   ACE-inhibitor cough 2016   Anxiety    Arthritis    Cancer ([place])    prostate; Gleason score 7.  Confined to prostate.  Lymph nodes negative.   Diabetes mellitus without complication ([place]) 56/3149   A1C was 10.3%   Elevated PSA, less than 10 ng/ml 01/22/2020   Free PSA 0.53   Food allergy, peanut 07/21/2018   Also pecan   Hyperlipidemia    Hypertension 11/2014   Left inguinal hernia    Low back pain    History of back injury as a teenager in [place].  States he had a fractured vertebrae.  Had ORIF.   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   Tinea pedis of both feet 09/13/2019   Onychomycosis 09/13/2019   Diabetic peripheral neuropathy ([place]) 07/04/2019   Pain due to onychomycosis of toenails of both feet 03/29/2019   Food allergy, peanut 07/21/2018   Low back pain    Diabetes mellitus without complication ([place]) 88/50/2774   Essential hypertension 11/02/2014   Microalbuminuria 11/02/2014   Hypertension 11/2014   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/04/2014    Past  Surgical History:  Procedure Laterality Date   Banding of internal hemorrhoids  1996   Extensive hand surgery  1977   pinning of fracture 4th Metacarpal fracture   Foreign body removal right hand  2006   HERNIA REPAIR Right 1999   x 3   INGUINAL HERNIA REPAIR Left 10/18/2018   Procedure: LEFT INGUINAL HERNIA REPAIR WITH MESH;  Surgeon: Donnie Mesa, MD;  Location: [place];  Service: General;  Laterality: Left;   LYMPHADENECTOMY Bilateral 07/05/2020   Procedure: LYMPHADENECTOMY, PELVIC;  Surgeon: Janith Lima, MD;  [facility];  Service: Urology;  Laterality: Bilateral;   NOSE SURGERY  1998   ORIF vertebral fracture  1975   ROBOT ASSISTED LAPAROSCOPIC RADICAL PROSTATECTOMY N/A 07/05/2020   Procedure: XI ROBOTIC ASSISTED LAPAROSCOPIC RADICAL PROSTATECTOMY;  Surgeon: Janith Lima, MD;  [facility];  Service: Urology;  Laterality: N/A;  ONLY NEEDS 240 MIN FOR ALL PROCEDURES   UMBILICAL HERNIA REPAIR N/A 10/18/2018   Procedure: UMBILICAL HERNIA REPAIR WITH MESH;  Surgeon: Donnie Mesa, MD;  Location: [place];  Service: General;  Laterality: N/A;       Home Medications    Prior to Admission medications   Medication Sig Start Date End Date Taking? Authorizing Provider  benzonatate (TESSALON) 100 MG capsule  Take 1 capsule (100 mg total) by mouth 3 (three) times daily as needed for cough. 07/07/21  Yes Barrett Henle, MD  atorvastatin (LIPITOR) 40 MG tablet TAKE 1 & 1/2 (ONE & ONE-HALF) TABLETS BY MOUTH ONCE DAILY WITH EVENING MEAL 11/29/20   [name], MD  baclofen (LIORESAL) 10 MG tablet Take 0.5 tablets (5 mg total) by mouth 2 (two) times daily. 06/16/21   White, Leitha Schuller, NP  citalopram (CELEXA) 10 MG tablet Take 1 tablet (10 mg total) by mouth daily. Patient not taking: Reported on 12/31/2020 08/09/20   [name], MD  diclofenac Sodium (VOLTAREN) 1 % GEL Apply 2 to 4 g to affected areas up to twice daily. 10/04/20    [name], MD  EPINEPHrine (EPIPEN 2-PAK) 0.3 mg/0.3 mL IJ SOAJ injection Inject 0.3 mg into the muscle as needed for anaphylaxis. 10/04/20   [name], MD  hydrochlorothiazide (HYDRODIURIL) 25 MG tablet TAKE 1 TABLET BY MOUTH IN THE MORNING WITH LOSARTAN 01/16/21   [name], MD  ibuprofen (ADVIL) 800 MG tablet Take 1 tablet (800 mg total) by mouth 3 (three) times daily. 06/16/21   White, Leitha Schuller, NP  lidocaine (XYLOCAINE) 2 % solution Use as directed 15 mLs in the mouth or throat every 4 (four) hours as needed for mouth pain. 06/16/21   Hans Eden, NP  losartan (COZAAR) 50 MG tablet Take 2 tablets by mouth once daily 11/16/20   [name], MD  metFORMIN (GLUCOPHAGE) 500 MG tablet TAKE 1 TABLET BY MOUTH TWICE DAILY WITH A MEAL 06/27/21   [name], MD    Family History Family History  Problem Relation Age of Onset   Cancer Mother        colon cancer   Hypertension Mother    Colon cancer Mother 41   Heart disease Father        MI was cause of death during a seizure   Seizures Father    Cancer Sister        Lung--smoker   Seizures Sister    Colon polyps Sister    Multiple sclerosis Brother    Heart disease Brother        history of MI   Gallstones Daughter    Heart disease Brother 20       MI   Esophageal cancer Neg Hx    Stomach cancer Neg Hx    Rectal cancer Neg Hx     Social History Social History   Tobacco Use   Smoking status: Never   Smokeless tobacco: Never  Vaping Use   Vaping Use: Never used  Substance Use Topics   Alcohol use: Not Currently    Comment: None since beginning of March 2022 following prostatectomy.   Drug use: Never     Allergies   Cherry extract, Fruit & vegetable daily [nutritional supplements], Other, Peach [prunus persica], Pecan nut (diagnostic), Penicillins, Pineapple, and Lisinopril   Review of Systems Review of Systems  Respiratory:  Positive for cough.     Physical Exam Triage  Vital Signs ED Triage Vitals  Enc Vitals Group     BP 07/07/21 1920 134/86     Pulse Rate 07/07/21 1920 (!) 104     Resp 07/07/21 1920 17     Temp 07/07/21 1920 98.7 F (37.1 C)     Temp Source 07/07/21 1920 Oral     SpO2 07/07/21 1920 95 %     Weight --      Height --  Head Circumference --      Peak Flow --      Pain Score 07/07/21 1919 0     Pain Loc --      Pain Edu? --      Excl. in [place]? --    No data found.  Updated Vital Signs BP 134/86 (BP Location: Left Arm)    Pulse (!) 104    Temp 98.7 F (37.1 C) (Oral)    Resp 17    SpO2 95%   Visual Acuity Right Eye Distance:   Left Eye Distance:   Bilateral Distance:    Right Eye Near:   Left Eye Near:    Bilateral Near:     Physical Exam Vitals reviewed.  Constitutional:      General: He is not in acute distress.    Appearance: He is not toxic-appearing.  HENT:     Nose: Nose normal.     Mouth/Throat:     Mouth: Mucous membranes are moist.     Pharynx: No oropharyngeal exudate or posterior oropharyngeal erythema.  Eyes:     Extraocular Movements: Extraocular movements intact.     Pupils: Pupils are equal, round, and reactive to light.  Cardiovascular:     Rate and Rhythm: Normal rate and regular rhythm.     Heart sounds: No murmur heard. Pulmonary:     Effort: Pulmonary effort is normal. No respiratory distress.     Breath sounds: No stridor. No wheezing, rhonchi or rales.  Musculoskeletal:     Cervical back: Neck supple.  Lymphadenopathy:     Cervical: No cervical adenopathy.  Skin:    Capillary Refill: Capillary refill takes less than 2 seconds.     Coloration: Skin is not jaundiced or pale.  Neurological:     Mental Status: He is oriented to person, place, and time.  Psychiatric:        Behavior: Behavior normal.     UC Treatments / Results  Labs (all labs ordered are listed, but only abnormal results are displayed) Labs Reviewed - No data to display  EKG   Radiology DG Chest 2  View  Result Date: 07/07/2021 CLINICAL DATA:  History of COVID several weeks ago with persistent cough, initial encounter EXAM: CHEST - 2 VIEW COMPARISON:  07/21/2018 FINDINGS: The heart size and mediastinal contours are within normal limits. Both lungs are clear. The visualized skeletal structures are unremarkable. IMPRESSION: No active cardiopulmonary disease. Electronically Signed   By: Inez Catalina M.D.   On: 07/07/2021 19:54    Procedures Procedures (including critical care time)  Medications Ordered in UC Medications - No data to display  Initial Impression / Assessment and Plan / UC Course  I have reviewed the triage vital signs and the nursing notes.  Pertinent labs & imaging results that were available during my care of the patient were reviewed by me and considered in my medical decision making (see chart for details).     Chest x-ray is negative for infiltrate.  We will treat the cough.  We discussed the length of time it can take for the cough to completely resolve after an upper respiratory infection of any kind Final Clinical Impressions(s) / UC Diagnoses   Final diagnoses:  Viral URI with cough     Discharge Instructions      Your chest x-ray was clear.  You can take benzonatate 100 mg, 1 capsule 3 times daily as needed for cough.  Also take Mucinex DM or Robitussin-DM as  needed.       ED Prescriptions     Medication Sig Dispense Auth. Provider   benzonatate (TESSALON) 100 MG capsule Take 1 capsule (100 mg total) by mouth 3 (three) times daily as needed for cough. 21 capsule Barrett Henle, MD      PDMP not reviewed this encounter.   Barrett Henle, MD 07/07/21 2015

## 2021-07-07 NOTE — Discharge Instructions (Addendum)
Your chest x-ray was clear. ? ?You can take benzonatate 100 mg, 1 capsule 3 times daily as needed for cough. ? ?Also take Mucinex DM or Robitussin-DM as needed. ? ? ?

## 2021-07-22 ENCOUNTER — Other Ambulatory Visit: Payer: Self-pay

## 2021-07-22 ENCOUNTER — Ambulatory Visit (INDEPENDENT_AMBULATORY_CARE_PROVIDER_SITE_OTHER): Payer: Medicare Other | Admitting: Podiatry

## 2021-07-22 ENCOUNTER — Encounter: Payer: Self-pay | Admitting: Podiatry

## 2021-07-22 DIAGNOSIS — M79675 Pain in left toe(s): Secondary | ICD-10-CM

## 2021-07-22 DIAGNOSIS — B351 Tinea unguium: Secondary | ICD-10-CM

## 2021-07-22 DIAGNOSIS — M79674 Pain in right toe(s): Secondary | ICD-10-CM

## 2021-07-22 DIAGNOSIS — E1142 Type 2 diabetes mellitus with diabetic polyneuropathy: Secondary | ICD-10-CM

## 2021-07-22 NOTE — Progress Notes (Signed)
This patient presents the office for treatment of his long painful toenails both feet.  Patient states that he has back problems and he is unable to self treat his nails.  Patient states the he is diabetic..  He presents the office today for  nail care.  General Appearance  Alert, conversant and in no acute stress.  Vascular  Dorsalis pedis and posterior tibial  pulses are  weakly  palpable  bilaterally.  Capillary return is within normal limits  bilaterally. Temperature is within normal limits  bilaterally.  Neurologic  Senn-Weinstein monofilament wire test within normal limits  bilaterally. Muscle power within normal limits bilaterally.  Nails Thick disfigured discolored nails with subungual debris  from hallux to fifth toes bilaterally.  Orthopedic  No limitations of motion  feet .  No crepitus or effusions noted.  No bony pathology or digital deformities noted. Hammer toe 2-5  B/L.  Hallux malleus  B/L  Skin  normotropic skin with no porokeratosis noted bilaterally.  No signs of infections or ulcers noted.    Onychomycosis  X 10.  Diabetes  ROV.  Debride nails.  Discussed neuropathy with this patient RTC 3 months.    Djuana Littleton DPM 

## 2021-08-05 ENCOUNTER — Telehealth: Payer: Self-pay

## 2021-08-05 NOTE — Telephone Encounter (Signed)
Patient would like an appointment for back pain that he had for a few months. Feels like pain in his muscles. Pain had gotten progressively worse. Pain is worst at night when laying down on his back. Has tried ibuprofen (dosage unknown), but it has not helped any. ?

## 2021-08-06 NOTE — Telephone Encounter (Signed)
Patient has been scheduled for 08/07/21 ?

## 2021-08-07 ENCOUNTER — Ambulatory Visit (INDEPENDENT_AMBULATORY_CARE_PROVIDER_SITE_OTHER): Payer: Medicare Other | Admitting: Internal Medicine

## 2021-08-07 ENCOUNTER — Encounter: Payer: Self-pay | Admitting: Internal Medicine

## 2021-08-07 VITALS — BP 110/64 | HR 100 | Resp 16 | Ht 69.5 in | Wt 204.0 lb

## 2021-08-07 DIAGNOSIS — G5601 Carpal tunnel syndrome, right upper limb: Secondary | ICD-10-CM | POA: Diagnosis not present

## 2021-08-07 DIAGNOSIS — M25511 Pain in right shoulder: Secondary | ICD-10-CM | POA: Diagnosis not present

## 2021-08-07 DIAGNOSIS — G8929 Other chronic pain: Secondary | ICD-10-CM | POA: Diagnosis not present

## 2021-08-07 DIAGNOSIS — G4700 Insomnia, unspecified: Secondary | ICD-10-CM

## 2021-08-07 DIAGNOSIS — M25512 Pain in left shoulder: Secondary | ICD-10-CM

## 2021-08-07 MED ORDER — TRAZODONE HCL 50 MG PO TABS: 25.0000 mg | ORAL_TABLET | Freq: Every evening | ORAL | 11 refills | Status: DC | PRN

## 2021-08-07 MED ORDER — NAPROXEN 500 MG PO TABS: ORAL_TABLET | ORAL | 6 refills | Status: DC

## 2021-08-07 NOTE — Progress Notes (Signed)
    Subjective:    Patient ID: Spencer Walton., male   DOB: July 25, 1955, 66 y.o.   MRN: [id number]   HPI  Having multiple areas of muscular pain in the evening when lies down to relax.  He is having difficulty sleeping at night due to the pain.  When he is outside doing yardwork during the day, he feels pretty well.   Also having numbness in his right arm and into hand--whole hand is asleep--mainly on radial hand side.  This only happens at night when sleeping.  He can shake it out, but hurts when feeling is coming back.   He is right handed. He has to hold the handle of his lawn mower tightly to get forward propulsion.  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    Current Meds  Medication Sig   atorvastatin (LIPITOR) 40 MG tablet TAKE 1 & 1/2 (ONE & ONE-HALF) TABLETS BY MOUTH ONCE DAILY WITH EVENING MEAL   diclofenac Sodium (VOLTAREN) 1 % GEL Apply 2 to 4 g to affected areas up to twice daily.   EPINEPHrine (EPIPEN 2-PAK) 0.3 mg/0.3 mL IJ SOAJ injection Inject 0.3 mg into the muscle as needed for anaphylaxis.   hydrochlorothiazide (HYDRODIURIL) 25 MG tablet TAKE 1 TABLET BY MOUTH IN THE MORNING WITH LOSARTAN   losartan (COZAAR) 50 MG tablet Take 2 tablets by mouth once daily   metFORMIN (GLUCOPHAGE) 500 MG tablet TAKE 1 TABLET BY MOUTH TWICE DAILY WITH A MEAL   Allergies  Allergen Reactions   Cherry Extract Anaphylaxis, Hives, Swelling and Rash   Fruit & Vegetable Daily [Nutritional Supplements] Anaphylaxis, Hives, Swelling and Rash    Fruit cocktail: Lips swell   Other Anaphylaxis and Swelling    NO NUTS!!!!   Peach [Prunus Persica] Anaphylaxis and Swelling   Pecan Nut (Diagnostic) Shortness Of Breath and Swelling    Throat and tongue swelling   Penicillins Anaphylaxis and Swelling    Has patient had a PCN reaction causing immediate rash, facial/tongue/throat swelling, SOB or lightheadedness with hypotension: Yes Has patient had a PCN reaction causing severe rash involving mucus  membranes or skin necrosis: No Has patient had a PCN reaction that required hospitalization: No Has patient had a PCN reaction occurring within the last 10 years: No If all of the above answers are "NO", then may proceed with Cephalosporin use.    Pineapple Anaphylaxis, Hives and Swelling   Lisinopril Cough     Review of Systems    Objective:   BP 110/64 (BP Location: Right Arm, Patient Position: Sitting, Cuff Size: Normal)   Pulse 100   Resp 16   Ht 5' 9.5" (1.765 m)   Wt 204 lb (92.5 kg)   BMI 29.69 kg/m   Physical Exam NAD Lungs:  CTA CV:  RRR without murmur or rub. MS/Neuro:  + Tinels and Phalens at right volar wrist.  Good grip bilaterally.  Bilateral shoulders with full ROM.  Mild tenderness over AC and subacromial bursae.  No erythema or swelling.   Assessment & Plan    Right Carpal Tunnel Syndrome:  Cock up splint at bedtime.  Naproxen 500 mg at bedtime.    2.  Insomnia:  was previously improved on Gabapentin.  Likely partly due to different areas of discomfort.  Trazodone 25 to 50 mg at bedtime.  3.  Bilateral shoulder pain:  Naproxen at bedtime for 14 days at bedtime as above.

## 2021-08-07 NOTE — Patient Instructions (Signed)
Cock up splint. For right hand/wrist ?

## 2021-08-12 ENCOUNTER — Encounter: Payer: Medicare Other | Admitting: Internal Medicine

## 2021-09-05 ENCOUNTER — Other Ambulatory Visit: Payer: Medicare Other

## 2021-10-24 ENCOUNTER — Encounter: Payer: Medicare Other | Admitting: Internal Medicine

## 2021-10-29 ENCOUNTER — Encounter: Payer: Self-pay | Admitting: Podiatry

## 2021-10-29 ENCOUNTER — Ambulatory Visit (INDEPENDENT_AMBULATORY_CARE_PROVIDER_SITE_OTHER): Payer: Medicare Other | Admitting: Podiatry

## 2021-10-29 DIAGNOSIS — E1142 Type 2 diabetes mellitus with diabetic polyneuropathy: Secondary | ICD-10-CM

## 2021-10-29 DIAGNOSIS — M79675 Pain in left toe(s): Secondary | ICD-10-CM | POA: Diagnosis not present

## 2021-10-29 DIAGNOSIS — B351 Tinea unguium: Secondary | ICD-10-CM | POA: Diagnosis not present

## 2021-10-29 DIAGNOSIS — M79674 Pain in right toe(s): Secondary | ICD-10-CM | POA: Diagnosis not present

## 2021-10-29 NOTE — Progress Notes (Signed)
This patient presents the office for treatment of his long painful toenails both feet.  Patient states that he has back problems and he is unable to self treat his nails.  Patient states the he is diabetic..  He presents the office today for  nail care.  General Appearance  Alert, conversant and in no acute stress.  Vascular  Dorsalis pedis and posterior tibial  pulses are  weakly  palpable  bilaterally.  Capillary return is within normal limits  bilaterally. Temperature is within normal limits  bilaterally.  Neurologic  Senn-Weinstein monofilament wire test within normal limits  bilaterally. Muscle power within normal limits bilaterally.  Nails Thick disfigured discolored nails with subungual debris  from hallux to fifth toes bilaterally.  Orthopedic  No limitations of motion  feet .  No crepitus or effusions noted.  No bony pathology or digital deformities noted. Hammer toe 2-5  B/L.  Hallux malleus  B/L  Skin  normotropic skin with no porokeratosis noted bilaterally.  No signs of infections or ulcers noted.    Onychomycosis  X 10.  Diabetes  ROV.  Debride nails.  Discussed neuropathy with this patient RTC 3 months.    Wilburta Milbourn DPM 

## 2021-11-10 ENCOUNTER — Ambulatory Visit (INDEPENDENT_AMBULATORY_CARE_PROVIDER_SITE_OTHER): Payer: Medicare Other | Admitting: Interventional Cardiology

## 2021-11-10 ENCOUNTER — Encounter: Payer: Self-pay | Admitting: Interventional Cardiology

## 2021-11-10 VITALS — BP 152/98 | HR 97 | Ht 72.0 in | Wt 210.0 lb

## 2021-11-10 DIAGNOSIS — I10 Essential (primary) hypertension: Secondary | ICD-10-CM

## 2021-11-10 DIAGNOSIS — I700 Atherosclerosis of aorta: Secondary | ICD-10-CM

## 2021-11-10 DIAGNOSIS — R072 Precordial pain: Secondary | ICD-10-CM

## 2021-11-10 DIAGNOSIS — E782 Mixed hyperlipidemia: Secondary | ICD-10-CM | POA: Diagnosis not present

## 2021-11-10 DIAGNOSIS — E119 Type 2 diabetes mellitus without complications: Secondary | ICD-10-CM | POA: Diagnosis not present

## 2021-11-10 LAB — BASIC METABOLIC PANEL WITH GFR
BUN/Creatinine Ratio: 17 (ref 10–24)
BUN: 17 mg/dL (ref 8–27)
CO2: 21 mmol/L (ref 20–29)
Calcium: 10.4 mg/dL — ABNORMAL HIGH (ref 8.6–10.2)
Chloride: 99 mmol/L (ref 96–106)
Creatinine, Ser: 1.01 mg/dL (ref 0.76–1.27)
Glucose: 125 mg/dL — ABNORMAL HIGH (ref 70–99)
Potassium: 4.5 mmol/L (ref 3.5–5.2)
Sodium: 137 mmol/L (ref 134–144)
eGFR: 83 mL/min/1.73

## 2021-11-10 MED ORDER — METOPROLOL TARTRATE 100 MG PO TABS: ORAL_TABLET | ORAL | 0 refills | Status: DC

## 2021-11-10 NOTE — Patient Instructions (Addendum)
Medication Instructions:  Your physician recommends that you continue on your current medications as directed. Please refer to the Current Medication list given to you today.  *If you need a refill on your cardiac medications before your next appointment, please call your pharmacy*   Lab Work: Lab work to be done today--BMP If you have labs (blood work) drawn today and your tests are completely normal, you will receive your results only by: [place] (if you have MyChart) OR A paper copy in the mail If you have any lab test that is abnormal or we need to change your treatment, we will call you to review the results.   Testing/Procedures: Your physician has requested that you have cardiac CT. Cardiac computed tomography (CT) is a painless test that uses an x-ray machine to take clear, detailed pictures of your heart. For further information please visit [email or web address]. Please follow instruction sheet as given.     Follow-Up: At [facility], you and your health needs are our priority.  As part of our continuing mission to provide you with exceptional heart care, we have created designated Provider Care Teams.  These Care Teams include your primary Cardiologist (physician) and Advanced Practice Providers (APPs -  Physician Assistants and Nurse Practitioners) who all work together to provide you with the care you need, when you need it.  We recommend signing up for the patient portal called "MyChart".  Sign up information is provided on this After Visit Summary.  MyChart is used to connect with patients for Virtual Visits (Telemedicine).  Patients are able to view lab/test results, encounter notes, upcoming appointments, etc.  Non-urgent messages can be sent to your provider as well.   To learn more about what you can do with MyChart, go to [email or web address].    Your next appointment:   Based on results  The format for your next appointment:   In Person  Provider:    Larae Grooms, MD     Other Instructions    Your cardiac CT will be scheduled at one of the below locations:   [facility] [address] [place], [place] [address] [phone]  [place] [address] [place], [place] [address] [phone]  If scheduled at [facility], please arrive at the [facility] and Children's Entrance (Entrance C2) of [facility] 30 minutes prior to test start time. You can use the FREE valet parking offered at entrance C (encouraged to control the heart rate for the test)  Proceed to the [facility] Radiology Department (first floor) to check-in and test prep.  All radiology patients and guests should use entrance C2 at [facility], accessed from [facility], even though the hospital's physical address listed is [address].    If scheduled at [facility], please arrive 15 mins early for check-in and test prep.  Please follow these instructions carefully (unless otherwise directed):  Hold all erectile dysfunction medications at least 3 days (72 hrs) prior to test.  On the Night Before the Test: Be sure to Drink plenty of water. Do not consume any caffeinated/decaffeinated beverages or chocolate 12 hours prior to your test. Do not take any antihistamines 12 hours prior to your test.   On the Day of the Test: Drink plenty of water until 1 hour prior to the test. Do not eat any food 4 hours prior to the test. You may take your regular medications  prior to the test.  Take metoprolol (Lopressor) two hours prior to test. HOLD Furosemide/Hydrochlorothiazide morning of the test.        After the Test: Drink plenty of water. After receiving IV contrast, you may experience a mild flushed feeling. This is normal. On occasion, you may experience a mild rash up to 24 hours after the test. This is not dangerous.  If this occurs, you can take Benadryl 25 mg and increase your fluid intake. If you experience trouble breathing, this can be serious. If it is severe call 911 IMMEDIATELY. If it is mild, please call our office. If you take any of these medications: Glipizide/Metformin, Avandament, Glucavance, please do not take 48 hours after completing test unless otherwise instructed.  We will call to schedule your test 2-4 weeks out understanding that some insurance companies will need an authorization prior to the service being performed.   For non-scheduling related questions, please contact the cardiac imaging nurse navigator should you have any questions/concerns: Marchia Bond, Cardiac Imaging Nurse Navigator Gordy Clement, Cardiac Imaging Nurse Navigator [facility] Direct Office Dial: [phone]   For scheduling needs, including cancellations and rescheduling, please call [place], [phone].  High-Fiber Eating Plan Fiber, also called dietary fiber, is a type of carbohydrate. It is found foods such as fruits, vegetables, whole grains, and beans. A high-fiber diet can have many health benefits. Your health care provider may recommend a high-fiber diet to help: Prevent constipation. Fiber can make your bowel movements more regular. Lower your cholesterol. Relieve the following conditions: Inflammation of veins in the anus (hemorrhoids). Inflammation of specific areas of the digestive tract (uncomplicated diverticulosis). A problem of the large intestine, also called the colon, that sometimes causes pain and diarrhea (irritable bowel syndrome, or IBS). Prevent overeating as part of a weight-loss plan. Prevent heart disease, type 2 diabetes, and certain cancers. What are tips for following this plan? Reading food labels  Check the nutrition facts label on food products for the amount of dietary fiber. Choose foods that have 5 grams of fiber or more per serving. The  goals for recommended daily fiber intake include: Men (age 77 or younger): 34-38 g. Men (over age 29): 28-34 g. Women (age 49 or younger): 25-28 g. Women (over age 54): 22-25 g. Your daily fiber goal is _____________ g. Shopping Choose whole fruits and vegetables instead of processed forms, such as apple juice or applesauce. Choose a wide variety of high-fiber foods such as avocados, lentils, oats, and kidney beans. Read the nutrition facts label of the foods you choose. Be aware of foods with added fiber. These foods often have high sugar and sodium amounts per serving. Cooking Use whole-grain flour for baking and cooking. Cook with brown rice instead of white rice. Meal planning Start the day with a breakfast that is high in fiber, such as a cereal that contains 5 g of fiber or more per serving. Eat breads and cereals that are made with whole-grain flour instead of refined flour or white flour. Eat brown rice, bulgur wheat, or millet instead of white rice. Use beans in place of meat in soups, salads, and pasta dishes. Be sure that half of the grains you eat each day are whole grains. General information You can get the recommended daily intake of dietary fiber by: Eating a variety of fruits, vegetables, grains, nuts, and beans. Taking a fiber supplement if you are not able to take in enough fiber in your diet. It is  better to get fiber through food than from a supplement. Gradually increase how much fiber you consume. If you increase your intake of dietary fiber too quickly, you may have bloating, cramping, or gas. Drink plenty of water to help you digest fiber. Choose high-fiber snacks, such as berries, raw vegetables, nuts, and popcorn. What foods should I eat? Fruits Berries. Pears. Apples. Oranges. Avocado. Prunes and raisins. Dried figs. Vegetables Sweet potatoes. Spinach. Kale. Artichokes. Cabbage. Broccoli. Cauliflower. Green peas. Carrots. Squash. Grains Whole-grain breads.  Multigrain cereal. Oats and oatmeal. Brown rice. Barley. Bulgur wheat. Shirley. Quinoa. Bran muffins. Popcorn. Rye wafer crackers. Meats and other proteins Navy beans, kidney beans, and pinto beans. Soybeans. Split peas. Lentils. Nuts and seeds. Dairy Fiber-fortified yogurt. Beverages Fiber-fortified soy milk. Fiber-fortified orange juice. Other foods Fiber bars. The items listed above may not be a complete list of recommended foods and beverages. Contact a dietitian for more information. What foods should I avoid? Fruits Fruit juice. Cooked, strained fruit. Vegetables Fried potatoes. Canned vegetables. Well-cooked vegetables. Grains White bread. Pasta made with refined flour. White rice. Meats and other proteins Fatty cuts of meat. Fried chicken or fried fish. Dairy Milk. Yogurt. Cream cheese. Sour cream. Fats and oils Butters. Beverages Soft drinks. Other foods Cakes and pastries. The items listed above may not be a complete list of foods and beverages to avoid. Talk with your dietitian about what choices are best for you. Summary Fiber is a type of carbohydrate. It is found in foods such as fruits, vegetables, whole grains, and beans. A high-fiber diet has many benefits. It can help to prevent constipation, lower blood cholesterol, aid weight loss, and reduce your risk of heart disease, diabetes, and certain cancers. Increase your intake of fiber gradually. Increasing fiber too quickly may cause cramping, bloating, and gas. Drink plenty of water while you increase the amount of fiber you consume. The best sources of fiber include whole fruits and vegetables, whole grains, nuts, seeds, and beans. This information is not intended to replace advice given to you by your health care provider. Make sure you discuss any questions you have with your health care provider. Document Revised: 08/24/2019 Document Reviewed: 08/24/2019 Elsevier Patient Education  [place]

## 2021-11-10 NOTE — Progress Notes (Signed)
Cardiology Office Note   Date:  11/10/2021   ID:  Spencer Walton., DOB 1956/01/18, MRN [id number]  PCP:  [name], MD    No chief complaint on file.    Wt Readings from Last 3 Encounters:  11/10/21 210 lb (95.3 kg)  08/07/21 204 lb (92.5 kg)  12/31/20 212 lb (96.2 kg)       History of Present Illness: Spencer Walton. is a 66 y.o. male who is being seen today for the evaluation of chest pain at the request of [name], MD.   He has multiple risk factors for CAD including hypertension, diabetes and hyperlipidemia.  These have been managed by his primary care doctor.  CP has been going on for a year.  Worse with stress.  Mowing the grass is most strenuous activity.  Not always related to exertion.  Often happens after activity when he relaxes. Sx last a few minutes.  Has not had to take any medicine for relief. No radiation of the pain. No diaphoresis.   No prior heart testing.   Prostatectomy done in 2023 without cardiac issue.   Denies :  Leg edema. Nitroglycerin use. Orthopnea. Palpitations. Paroxysmal nocturnal dyspnea. Shortness of breath. Syncope.    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  Lymph nodes negative.   Diabetes mellitus without complication ([name]) 02/2724   A1C was 10.3%   Elevated PSA, less than 10 ng/ml 01/22/2020   Free PSA 0.53   Food allergy, peanut 07/21/2018   Also pecan   Hyperlipidemia    Hypertension 11/2014   Left inguinal hernia    Low back pain    History of back injury as a teenager in [place].  States he had a fractured vertebrae.  Had ORIF.   Microalbuminuria 11/2014    Past Surgical History:  Procedure Laterality Date   Banding of internal hemorrhoids  1996   Extensive hand surgery  1977   pinning of fracture 4th Metacarpal fracture   Foreign body removal right hand  2006   HERNIA  REPAIR Right 1999   x 3   INGUINAL HERNIA REPAIR Left 10/18/2018   Procedure: LEFT INGUINAL HERNIA REPAIR WITH MESH;  Surgeon: Donnie Mesa, MD;  Location: [facility];  Service: General;  Laterality: Left;   LYMPHADENECTOMY Bilateral 07/05/2020   Procedure: LYMPHADENECTOMY, PELVIC;  Surgeon: Janith Lima, MD;  Location: [facility];  Service: Urology;  Laterality: Bilateral;   NOSE SURGERY  1998   ORIF vertebral fracture  1975   ROBOT ASSISTED LAPAROSCOPIC RADICAL PROSTATECTOMY N/A 07/05/2020   Procedure: XI ROBOTIC ASSISTED LAPAROSCOPIC RADICAL PROSTATECTOMY;  Surgeon: Janith Lima, MD;  Location: [facility];  Service: Urology;  Laterality: N/A;  ONLY NEEDS 240 MIN FOR ALL PROCEDURES   UMBILICAL HERNIA REPAIR N/A 10/18/2018   Procedure: UMBILICAL HERNIA REPAIR WITH MESH;  Surgeon: Donnie Mesa, MD;  Location: [facility];  Service: General;  Laterality: N/A;     Current Outpatient Medications  Medication Sig Dispense Refill   atorvastatin (LIPITOR) 40 MG tablet TAKE 1 & 1/2 (ONE & ONE-HALF) TABLETS BY MOUTH ONCE DAILY WITH EVENING MEAL 45 tablet 10   baclofen (LIORESAL) 10 MG tablet Take 0.5 tablets (5 mg total) by mouth 2 (two) times daily. (Patient taking differently: Take 5 mg by mouth as needed  for muscle spasms.) 30 each 0   cromolyn (OPTICROM) 4 % ophthalmic solution 1 drop 4 (four) times daily.     diclofenac Sodium (VOLTAREN) 1 % GEL Apply 2 to 4 g to affected areas up to twice daily. 350 g 6   EPINEPHrine (EPIPEN 2-PAK) 0.3 mg/0.3 mL IJ SOAJ injection Inject 0.3 mg into the muscle as needed for anaphylaxis. 1 each 1   hydrochlorothiazide (HYDRODIURIL) 25 MG tablet TAKE 1 TABLET BY MOUTH IN THE MORNING WITH LOSARTAN 30 tablet 11   losartan (COZAAR) 50 MG tablet Take 2 tablets by mouth once daily 60 tablet 10   metFORMIN (GLUCOPHAGE) 500 MG tablet TAKE 1 TABLET BY MOUTH TWICE DAILY WITH A MEAL 60 tablet 6   naproxen (NAPROSYN) 500 MG tablet 1 tab by mouth at  bedtime with small snack (Patient taking differently: as needed for mild pain or moderate pain. 1 tab by mouth at bedtime with small snack) 30 tablet 6   traZODone (DESYREL) 50 MG tablet Take 0.5-1 tablets (25-50 mg total) by mouth at bedtime as needed for sleep. 30 tablet 11   No current facility-administered medications for this visit.    Allergies:   Cherry extract, Fruit & vegetable daily [nutritional supplements], Other, Peach [prunus persica], Pecan nut (diagnostic), Penicillins, Pineapple, and Lisinopril    Social History:  The patient  reports that he has never smoked. He has never used smokeless tobacco. He reports that he does not currently use alcohol. He reports that he does not use drugs.   Family History:  The patient's family history includes Cancer in his mother and sister; Colon cancer (age of onset: 14) in his mother; Colon polyps in his sister; Gallstones in his daughter; Heart disease in his brother and father; Heart disease (age of onset: 62) in his brother; Hypertension in his mother; Multiple sclerosis in his brother; Seizures in his father and sister.    ROS:  Please see the history of present illness.   Otherwise, review of systems are positive for rare dizziness.   All other systems are reviewed and negative.    PHYSICAL EXAM: VS:  BP (!) 152/98   Pulse 97   Ht 6' (1.829 m)   Wt 210 lb (95.3 kg)   SpO2 97%   BMI 28.48 kg/m  , BMI Body mass index is 28.48 kg/m. GEN: Well nourished, well developed, in no acute distress HEENT: normal Neck: no JVD, carotid bruits, or masses Cardiac: RRR; no murmurs, rubs, or gallops,no edema  Respiratory:  clear to auscultation bilaterally, normal work of breathing GI: soft, nontender, nondistended, + BS, small umbilical hernia present MS: no deformity or atrophy Skin: warm and dry, no rash Neuro:  Strength and sensation are intact Psych: euthymic mood, full affect   EKG:   The ekg ordered today demonstrates normal sinus  rhythm   Recent Labs: No results found for requested labs within last 365 days.   Lipid Panel    Component Value Date/Time   CHOL 130 08/26/2020 0913   TRIG 69 08/26/2020 0913   HDL 46 08/26/2020 0913   LDLCALC 70 08/26/2020 0913     Other studies Reviewed: Additional studies/ records that were reviewed today with results demonstrating: LDL 70 in 08/2020.   ASSESSMENT AND PLAN:  Precordial Chest pain: Some typical and atypical features.  Given multiple risk factors, need to rule out ischemia.  Plan CTA coronaries.  Metoprolol 100 mg prior to CTA. HTN: Mostly controlled at home in the 120-130s.  Occasional 150 readings.  DM: A1c was 6.6 in August 2022.  Healthy lifestyle stressed. Hyperlipidemia: LDL 70 in April 2022.  Continue atorvastatin.  Whole food, plant-based diet.  High-fiber diet.  Avoid processed foods.   Aortic atherosclerosis: Needs lipid-lowering therapy and healthy diet.   Current medicines are reviewed at length with the patient today.  The patient concerns regarding his medicines were addressed.  The following changes have been made:  No change  Labs/ tests ordered today include:  No orders of the defined types were placed in this encounter.   Recommend 150 minutes/week of aerobic exercise Low fat, low carb, high fiber diet recommended  Disposition:   FU for CTA   Signed, Larae Grooms, MD  11/10/2021 9:58 AM    [facility] [place], [place], [place]  [address] Phone: [phone]; Fax: [phone]

## 2021-11-10 NOTE — H&P (View-Only) (Signed)
Cardiology Office Note   Date:  11/10/2021   ID:  Spencer Spring., DOB November 06, 1955, MRN [id number]  PCP:  Spencer Hook, MD    No chief complaint on file.    Wt Readings from Last 3 Encounters:  11/10/21 210 lb (95.3 kg)  08/07/21 204 lb (92.5 kg)  12/31/20 212 lb (96.2 kg)       History of Present Illness: Spencer Walton. is a 66 y.o. male who is being seen today for the evaluation of chest pain at the request of Spencer Hook, MD.   He has multiple risk factors for CAD including hypertension, diabetes and hyperlipidemia.  These have been managed by his primary care doctor.  CP has been going on for a year.  Worse with stress.  Mowing the grass is most strenuous activity.  Not always related to exertion.  Often happens after activity when he relaxes. Sx last a few minutes.  Has not had to take any medicine for relief. No radiation of the pain. No diaphoresis.   No prior heart testing.   Prostatectomy done in 2023 without cardiac issue.   Denies :  Leg edema. Nitroglycerin use. Orthopnea. Palpitations. Paroxysmal nocturnal dyspnea. Shortness of breath. Syncope.    Past Medical History:  Diagnosis Date   ACE-inhibitor cough 2016   Anxiety    Arthritis    Cancer ([place])    prostate; Gleason score 7.  Confined to prostate.  Lymph nodes negative.   Diabetes mellitus without complication ([place]) 52/7782   A1C was 10.3%   Elevated PSA, less than 10 ng/ml 01/22/2020   Free PSA 0.53   Food allergy, peanut 07/21/2018   Also pecan   Hyperlipidemia    Hypertension 11/2014   Left inguinal hernia    Low back pain    History of back injury as a teenager in [place].  States he had a fractured vertebrae.  Had ORIF.   Microalbuminuria 11/2014    Past Surgical History:  Procedure Laterality Date   Banding of internal hemorrhoids  1996   Extensive hand surgery  1977   pinning of fracture 4th Metacarpal fracture   Foreign body removal right hand  2006   HERNIA  REPAIR Right 1999   x 3   INGUINAL HERNIA REPAIR Left 10/18/2018   Procedure: LEFT INGUINAL HERNIA REPAIR WITH MESH;  Surgeon: Donnie Mesa, MD;  Location: [facility];  Service: General;  Laterality: Left;   LYMPHADENECTOMY Bilateral 07/05/2020   Procedure: LYMPHADENECTOMY, PELVIC;  Surgeon: Janith Lima, MD;  [facility];  Service: Urology;  Laterality: Bilateral;   NOSE SURGERY  1998   ORIF vertebral fracture  1975   ROBOT ASSISTED LAPAROSCOPIC RADICAL PROSTATECTOMY N/A 07/05/2020   Procedure: XI ROBOTIC ASSISTED LAPAROSCOPIC RADICAL PROSTATECTOMY;  Surgeon: Janith Lima, MD;  [facility];  Service: Urology;  Laterality: N/A;  ONLY NEEDS 240 MIN FOR ALL PROCEDURES   UMBILICAL HERNIA REPAIR N/A 10/18/2018   Procedure: UMBILICAL HERNIA REPAIR WITH MESH;  Surgeon: Donnie Mesa, MD;  Location: [facility];  Service: General;  Laterality: N/A;     Current Outpatient Medications  Medication Sig Dispense Refill   atorvastatin (LIPITOR) 40 MG tablet TAKE 1 & 1/2 (ONE & ONE-HALF) TABLETS BY MOUTH ONCE DAILY WITH EVENING MEAL 45 tablet 10   baclofen (LIORESAL) 10 MG tablet Take 0.5 tablets (5 mg total) by mouth 2 (two) times daily. (Patient taking differently: Take 5 mg by mouth as needed  for muscle spasms.) 30 each 0   cromolyn (OPTICROM) 4 % ophthalmic solution 1 drop 4 (four) times daily.     diclofenac Sodium (VOLTAREN) 1 % GEL Apply 2 to 4 g to affected areas up to twice daily. 350 g 6   EPINEPHrine (EPIPEN 2-PAK) 0.3 mg/0.3 mL IJ SOAJ injection Inject 0.3 mg into the muscle as needed for anaphylaxis. 1 each 1   hydrochlorothiazide (HYDRODIURIL) 25 MG tablet TAKE 1 TABLET BY MOUTH IN THE MORNING WITH LOSARTAN 30 tablet 11   losartan (COZAAR) 50 MG tablet Take 2 tablets by mouth once daily 60 tablet 10   metFORMIN (GLUCOPHAGE) 500 MG tablet TAKE 1 TABLET BY MOUTH TWICE DAILY WITH A MEAL 60 tablet 6   naproxen (NAPROSYN) 500 MG tablet 1 tab by mouth at  bedtime with small snack (Patient taking differently: as needed for mild pain or moderate pain. 1 tab by mouth at bedtime with small snack) 30 tablet 6   traZODone (DESYREL) 50 MG tablet Take 0.5-1 tablets (25-50 mg total) by mouth at bedtime as needed for sleep. 30 tablet 11   No current facility-administered medications for this visit.    Allergies:   Cherry extract, Fruit & vegetable daily [nutritional supplements], Other, Peach [prunus persica], Pecan nut (diagnostic), Penicillins, Pineapple, and Lisinopril    Social History:  The patient  reports that he has never smoked. He has never used smokeless tobacco. He reports that he does not currently use alcohol. He reports that he does not use drugs.   Family History:  The patient's family history includes Cancer in his mother and sister; Colon cancer (age of onset: 57) in his mother; Colon polyps in his sister; Gallstones in his daughter; Heart disease in his brother and father; Heart disease (age of onset: 20) in his brother; Hypertension in his mother; Multiple sclerosis in his brother; Seizures in his father and sister.    ROS:  Please see the history of present illness.   Otherwise, review of systems are positive for rare dizziness.   All other systems are reviewed and negative.    PHYSICAL EXAM: VS:  BP (!) 152/98   Pulse 97   Ht 6' (1.829 m)   Wt 210 lb (95.3 kg)   SpO2 97%   BMI 28.48 kg/m  , BMI Body mass index is 28.48 kg/m. GEN: Well nourished, well developed, in no acute distress HEENT: normal Neck: no JVD, carotid bruits, or masses Cardiac: RRR; no murmurs, rubs, or gallops,no edema  Respiratory:  clear to auscultation bilaterally, normal work of breathing GI: soft, nontender, nondistended, + BS, small umbilical hernia present MS: no deformity or atrophy Skin: warm and dry, no rash Neuro:  Strength and sensation are intact Psych: euthymic mood, full affect   EKG:   The ekg ordered today demonstrates normal sinus  rhythm   Recent Labs: No results found for requested labs within last 365 days.   Lipid Panel    Component Value Date/Time   CHOL 130 08/26/2020 0913   TRIG 69 08/26/2020 0913   HDL 46 08/26/2020 0913   LDLCALC 70 08/26/2020 0913     Other studies Reviewed: Additional studies/ records that were reviewed today with results demonstrating: LDL 70 in 08/2020.   ASSESSMENT AND PLAN:  Precordial Chest pain: Some typical and atypical features.  Given multiple risk factors, need to rule out ischemia.  Plan CTA coronaries.  Metoprolol 100 mg prior to CTA. HTN: Mostly controlled at home in the 120-130s.  Occasional 150 readings.  DM: A1c was 6.6 in August 2022.  Healthy lifestyle stressed. Hyperlipidemia: LDL 70 in April 2022.  Continue atorvastatin.  Whole food, plant-based diet.  High-fiber diet.  Avoid processed foods.   Aortic atherosclerosis: Needs lipid-lowering therapy and healthy diet.   Current medicines are reviewed at length with the patient today.  The patient concerns regarding his medicines were addressed.  The following changes have been made:  No change  Labs/ tests ordered today include:  No orders of the defined types were placed in this encounter.   Recommend 150 minutes/week of aerobic exercise Low fat, low carb, high fiber diet recommended  Disposition:   FU for CTA   Signed, Larae Grooms, MD  11/10/2021 9:58 AM    [facility] [address] [place], [place]  [address] Phone: [phone]; Fax: [phone]

## 2021-11-13 ENCOUNTER — Other Ambulatory Visit: Payer: Self-pay | Admitting: Internal Medicine

## 2021-11-24 ENCOUNTER — Other Ambulatory Visit (INDEPENDENT_AMBULATORY_CARE_PROVIDER_SITE_OTHER): Payer: Medicare Other

## 2021-11-24 DIAGNOSIS — E118 Type 2 diabetes mellitus with unspecified complications: Secondary | ICD-10-CM | POA: Diagnosis not present

## 2021-11-24 DIAGNOSIS — Z79899 Other long term (current) drug therapy: Secondary | ICD-10-CM | POA: Diagnosis not present

## 2021-11-24 DIAGNOSIS — E782 Mixed hyperlipidemia: Secondary | ICD-10-CM

## 2021-11-25 LAB — CBC WITH DIFFERENTIAL/PLATELET
Basophils Absolute: 0.1 10*3/uL (ref 0.0–0.2)
Basos: 1 %
EOS (ABSOLUTE): 0.2 10*3/uL (ref 0.0–0.4)
Eos: 3 %
Hematocrit: 42.4 % (ref 37.5–51.0)
Hemoglobin: 14.2 g/dL (ref 13.0–17.7)
Immature Grans (Abs): 0 10*3/uL (ref 0.0–0.1)
Immature Granulocytes: 0 %
Lymphocytes Absolute: 2.2 10*3/uL (ref 0.7–3.1)
Lymphs: 37 %
MCH: 31.1 pg (ref 26.6–33.0)
MCHC: 33.5 g/dL (ref 31.5–35.7)
MCV: 93 fL (ref 79–97)
Monocytes Absolute: 0.4 10*3/uL (ref 0.1–0.9)
Monocytes: 7 %
Neutrophils Absolute: 3.1 10*3/uL (ref 1.4–7.0)
Neutrophils: 52 %
Platelets: 230 10*3/uL (ref 150–450)
RBC: 4.56 x10E6/uL (ref 4.14–5.80)
RDW: 11.9 % (ref 11.6–15.4)
WBC: 6 10*3/uL (ref 3.4–10.8)

## 2021-11-25 LAB — COMPREHENSIVE METABOLIC PANEL
ALT: 17 IU/L (ref 0–44)
AST: 17 IU/L (ref 0–40)
Albumin/Globulin Ratio: 2.1 (ref 1.2–2.2)
Albumin: 4.9 g/dL (ref 3.9–4.9)
Alkaline Phosphatase: 74 IU/L (ref 44–121)
BUN/Creatinine Ratio: 13 (ref 10–24)
BUN: 14 mg/dL (ref 8–27)
Bilirubin Total: 0.4 mg/dL (ref 0.0–1.2)
CO2: 19 mmol/L — ABNORMAL LOW (ref 20–29)
Calcium: 10 mg/dL (ref 8.6–10.2)
Chloride: 99 mmol/L (ref 96–106)
Creatinine, Ser: 1.12 mg/dL (ref 0.76–1.27)
Globulin, Total: 2.3 g/dL (ref 1.5–4.5)
Glucose: 123 mg/dL — ABNORMAL HIGH (ref 70–99)
Potassium: 4.1 mmol/L (ref 3.5–5.2)
Sodium: 140 mmol/L (ref 134–144)
Total Protein: 7.2 g/dL (ref 6.0–8.5)
eGFR: 73 mL/min/{1.73_m2} (ref >59)

## 2021-11-25 LAB — MICROALBUMIN / CREATININE URINE RATIO
Creatinine, Urine: 81.5 mg/dL
Microalb/Creat Ratio: 35 mg/g{creat} — ABNORMAL HIGH (ref 0–29)
Microalbumin, Urine: 28.2 ug/mL

## 2021-11-25 LAB — LIPID PANEL W/O CHOL/HDL RATIO
Cholesterol, Total: 146 mg/dL (ref 100–199)
HDL: 50 mg/dL (ref >39)
LDL Chol Calc (NIH): 81 mg/dL (ref 0–99)
Triglycerides: 80 mg/dL (ref 0–149)
VLDL Cholesterol Cal: 15 mg/dL (ref 5–40)

## 2021-11-25 LAB — HEMOGLOBIN A1C
Est. average glucose Bld gHb Est-mCnc: 143 mg/dL
Hgb A1c MFr Bld: 6.6 % — ABNORMAL HIGH (ref 4.8–5.6)

## 2021-12-01 ENCOUNTER — Encounter: Payer: Self-pay | Admitting: Internal Medicine

## 2021-12-01 ENCOUNTER — Telehealth (HOSPITAL_COMMUNITY): Payer: Self-pay | Admitting: Emergency Medicine

## 2021-12-01 ENCOUNTER — Encounter (HOSPITAL_COMMUNITY): Payer: Self-pay

## 2021-12-01 ENCOUNTER — Ambulatory Visit (INDEPENDENT_AMBULATORY_CARE_PROVIDER_SITE_OTHER): Payer: Medicare Other | Admitting: Internal Medicine

## 2021-12-01 ENCOUNTER — Other Ambulatory Visit (HOSPITAL_COMMUNITY): Payer: Self-pay

## 2021-12-01 VITALS — BP 130/80 | HR 96 | Resp 16 | Ht 71.0 in | Wt 203.0 lb

## 2021-12-01 DIAGNOSIS — I10 Essential (primary) hypertension: Secondary | ICD-10-CM

## 2021-12-01 DIAGNOSIS — R072 Precordial pain: Secondary | ICD-10-CM

## 2021-12-01 DIAGNOSIS — E118 Type 2 diabetes mellitus with unspecified complications: Secondary | ICD-10-CM | POA: Diagnosis not present

## 2021-12-01 DIAGNOSIS — E782 Mixed hyperlipidemia: Secondary | ICD-10-CM

## 2021-12-01 DIAGNOSIS — Z0000 Encounter for general adult medical examination without abnormal findings: Secondary | ICD-10-CM

## 2021-12-01 DIAGNOSIS — E1142 Type 2 diabetes mellitus with diabetic polyneuropathy: Secondary | ICD-10-CM

## 2021-12-01 DIAGNOSIS — G8929 Other chronic pain: Secondary | ICD-10-CM

## 2021-12-01 DIAGNOSIS — M546 Pain in thoracic spine: Secondary | ICD-10-CM

## 2021-12-01 MED ORDER — IVABRADINE HCL 5 MG PO TABS
10.0000 mg | ORAL_TABLET | Freq: Once | ORAL | 0 refills | Status: AC
  Filled 2021-12-01: qty 2, 1d supply, fill #0

## 2021-12-01 MED ORDER — IVABRADINE HCL 5 MG PO TABS: 10.0000 mg | ORAL_TABLET | Freq: Once | ORAL | 0 refills | Status: DC

## 2021-12-01 MED ORDER — GABAPENTIN 100 MG PO CAPS: ORAL_CAPSULE | ORAL | 11 refills | Status: DC

## 2021-12-01 MED ORDER — EPINEPHRINE 0.3 MG/0.3ML IJ SOAJ
0.3000 mg | INTRAMUSCULAR | 1 refills | Status: DC | PRN
Start: 2021-12-01 — End: 2024-03-09

## 2021-12-01 MED ORDER — ATORVASTATIN CALCIUM 80 MG PO TABS: ORAL_TABLET | ORAL | 11 refills | Status: DC

## 2021-12-01 NOTE — Telephone Encounter (Signed)
Reaching out to patient to offer assistance regarding upcoming cardiac imaging study; pt verbalizes understanding of appt date/time, parking situation and where to check in, pre-test NPO status and medications ordered, and verified current allergies; name and call back number provided for further questions should they arise Marchia Bond RN Navigator Cardiac Imaging [facility] [phone] office [phone] cell  Holding metformin, lisinopril, HCTZ Taking '100mg'$  metoprolol + '10mg'$  ivabradine (Verified pharm with wife) Aware of nitro Mychart instructions sent per request

## 2021-12-01 NOTE — Patient Instructions (Signed)
Gabapentin: Start taking Gabapentin 100 mg cap 1 cap at bedtime. In 3 days, increase to 2 caps at bedtime. In another 3 days, increase to 3 caps at bedtime You should be taking 3 caps at bedtime at this point. Stay on this dose until follow up If you do not tolerate increasing the dose at any point, hold on the dose you tolerate or call clinic if having problems

## 2021-12-01 NOTE — Progress Notes (Signed)
Subjective:    Patient ID: Spencer Spring., male   DOB: 1956/04/14, 66 y.o.   MRN: [id number]   HPI  Here for Male CPE:  1.  STE:  Does not perform.  No family history of testicular cancer.    2.  PSA:  history of prostate cancer, organ confined, for which he underwent prostatectomy last year.  07/2020.  [facility].  PSAs reportedly good since the surgery.    3.  Guaiac Cards/FIT:  Has never returned.  Not interested in performing.     4.  Colonoscopy: 04/01/2020 with Dr. Tarri Glenn, [facility].  Had 4 adenomatous polyps without dysplastic change.  Repeat planned for 03/2023.    5.  Cholesterol/Glucose:  LDL recently a bit higher at 81 mg, particularly with the possibility of CAD symptoms.  Taking Atorvastatin 60 mg daily.  DM well controlled at 6.6%  6.  Immunizations:  Has not had any COVID vaccinations.  Needs to think about it again.  Has not had pneumococcal 13 nor Shingles.    7.  Chest pain:  Patient self referred to Cardiology, Dr. Irish Lack, as he was having some chest discomfort.  Reportedly, was having fleeting chest discomfort with exertion for a year.  He was given an Rx for Metoprolol prior to Cardiac CT.  Has the CT planned for tomorrow.    Current Meds  Medication Sig   atorvastatin (LIPITOR) 40 MG tablet TAKE 1 & 1/2 (ONE & ONE-HALF) TABLETS BY MOUTH ONCE DAILY WITH EVENING MEAL   diclofenac Sodium (VOLTAREN) 1 % GEL Apply 2 to 4 g to affected areas up to twice daily.   EPINEPHrine (EPIPEN 2-PAK) 0.3 mg/0.3 mL IJ SOAJ injection Inject 0.3 mg into the muscle as needed for anaphylaxis.   hydrochlorothiazide (HYDRODIURIL) 25 MG tablet TAKE 1 TABLET BY MOUTH IN THE MORNING WITH LOSARTAN   ivabradine (CORLANOR) 5 MG TABS tablet Take 2 tablets (10 mg total) by mouth  2 hours prior to CT appt   losartan (COZAAR) 50 MG tablet Take 2 tablets by mouth once daily   metFORMIN (GLUCOPHAGE) 500 MG tablet TAKE 1 TABLET BY MOUTH TWICE DAILY WITH A MEAL   metoprolol tartrate  (LOPRESSOR) 100 MG tablet Take one tablet by mouth 2 hours prior to CT Scan   naproxen (NAPROSYN) 500 MG tablet 1 tab by mouth at bedtime with small snack (Patient taking differently: as needed for mild pain or moderate pain. 1 tab by mouth at bedtime with small snack)   traZODone (DESYREL) 50 MG tablet Take 0.5-1 tablets (25-50 mg total) by mouth at bedtime as needed for sleep.   Allergies  Allergen Reactions   Cherry Extract Anaphylaxis, Hives, Swelling and Rash   Fruit & Vegetable Daily [Nutritional Supplements] Anaphylaxis, Hives, Swelling and Rash    Fruit cocktail: Lips swell   Other Anaphylaxis and Swelling    NO NUTS!!!!   Peach [Prunus Persica] Anaphylaxis and Swelling   Pecan Nut (Diagnostic) Shortness Of Breath and Swelling    Throat and tongue swelling   Penicillins Anaphylaxis and Swelling    Has patient had a PCN reaction causing immediate rash, facial/tongue/throat swelling, SOB or lightheadedness with hypotension: Yes Has patient had a PCN reaction causing severe rash involving mucus membranes or skin necrosis: No Has patient had a PCN reaction that required hospitalization: No Has patient had a PCN reaction occurring within the last 10 years: No If all of the above answers are "NO", then may proceed with Cephalosporin  use.    Pineapple Anaphylaxis, Hives and Swelling   Lisinopril Cough   Past Medical History:  Diagnosis Date   ACE-inhibitor cough 2016   Anxiety    Arthritis    Cancer ([place])    prostate; Gleason score 7.  Confined to prostate.  Lymph nodes negative.   Diabetes mellitus without complication ([place]) 80/9983   A1C was 10.3%   Elevated PSA, less than 10 ng/ml 01/22/2020   Free PSA 0.53   Food allergy, peanut 07/21/2018   Also pecan   Hyperlipidemia    Hypertension 11/2014   Left inguinal hernia    Low back pain    History of back injury as a teenager in [place].  States he had a fractured vertebrae.  Had ORIF.   Microalbuminuria 11/2014   Past Surgical  History:  Procedure Laterality Date   Banding of internal hemorrhoids  1996   Extensive hand surgery  1977   pinning of fracture 4th Metacarpal fracture   Foreign body removal right hand  2006   HERNIA REPAIR Right 1999   x 3   INGUINAL HERNIA REPAIR Left 10/18/2018   Procedure: LEFT INGUINAL HERNIA REPAIR WITH MESH;  Surgeon: Donnie Mesa, MD;  Location: [facility];  Service: General;  Laterality: Left;   LYMPHADENECTOMY Bilateral 07/05/2020   Procedure: LYMPHADENECTOMY, PELVIC;  Surgeon: Janith Lima, MD;  [facility];  Service: Urology;  Laterality: Bilateral;   NOSE SURGERY  1998   ORIF vertebral fracture  1975   ROBOT ASSISTED LAPAROSCOPIC RADICAL PROSTATECTOMY N/A 07/05/2020   Procedure: XI ROBOTIC ASSISTED LAPAROSCOPIC RADICAL PROSTATECTOMY;  Surgeon: Janith Lima, MD;  [facility];  Service: Urology;  Laterality: N/A;  ONLY NEEDS 240 MIN FOR ALL PROCEDURES   UMBILICAL HERNIA REPAIR N/A 10/18/2018   Procedure: UMBILICAL HERNIA REPAIR WITH MESH;  Surgeon: Donnie Mesa, MD;  Location: [facility];  Service: General;  Laterality: N/A;   Family History  Problem Relation Age of Onset   Cancer Mother        colon cancer   Hypertension Mother    Colon cancer Mother 79   Heart disease Father        MI was cause of death during a seizure   Seizures Father    Cancer Sister        Lung--smoker   Seizures Sister    Colon polyps Sister    Cancer Brother        prostate   Multiple sclerosis Brother    Heart disease Brother        history of MI   Heart disease Brother 37       MI   Gallstones Daughter        not clear this is actually her diagnosis   Esophageal cancer Neg Hx    Stomach cancer Neg Hx    Rectal cancer Neg Hx    Social History   Socioeconomic History   Marital status: Married    Spouse name: Spencer Walton   Number of children: 2   Years of education: 12   Highest education level: Not on file  Occupational History    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   Drug use: Never   Sexual activity: Not Currently  Other Topics Concern   Not on file  Social History Narrative    Lives with wife and daughter   Married 44years (2023   Social Determinants of Health   Financial Resource Strain: Low Risk  (12/01/2021)   Overall Financial Resource Strain (CARDIA)    Difficulty of Paying Living Expenses: Not hard at all  Food Insecurity: No Food Insecurity (12/01/2021)   Hunger Vital Sign    Worried About Running Out of Food in the Last Year: Never true    Ran Out of Food in the Last Year: Never true  Transportation Needs: No Transportation Needs (12/01/2021)   PRAPARE - Hydrologist (Medical): No    Lack of Transportation (Non-Medical): No  Physical Activity: Not on file  Stress: No Stress Concern Present (01/07/2018)   [place]    Feeling of Stress : Not at all  Social Connections: Not on file  Intimate Partner Violence: Not At Risk (12/01/2021)   Humiliation, Afraid, Rape, and Kick questionnaire    Fear of Current or Ex-Partner: No    Emotionally Abused: No    Physically Abused: No    Sexually Abused: No     Review of Systems  Eyes:  Negative for visual disturbance (Has been to Dr. Katy Fitch.  Has a cataract.  He does not believe he has diabetic change.).  Respiratory:  Negative for shortness of breath.   Cardiovascular:  Positive for chest pain (See Cardiology notes--exertional and fleeting.).  Genitourinary:        Interested in something for ED.  Discussed need to wait until done with Cardiology  referral and not needing nitroglycerin.  Musculoskeletal:        Aching all over.  Down back then to knees.  Neurological:  Positive for numbness (Feet--with burning.).       Objective:   BP 130/80 (BP Location: Right Arm, Patient Position: Sitting, Cuff Size: Normal)   Pulse 96   Resp 16   Ht '5\' 11"'$  (1.803 m)   Wt 203 lb (92.1 kg)   BMI 28.31 kg/m   Physical Exam HENT:     Head: Normocephalic and atraumatic.     Right Ear: Tympanic membrane, ear canal and external ear normal.     Left Ear: Tympanic membrane, ear canal and external ear normal.     Nose: Nose normal.     Mouth/Throat:     Mouth: Mucous membranes are moist.     Pharynx: Oropharynx is clear.  Eyes:     Extraocular Movements: Extraocular movements intact.     Conjunctiva/sclera: Conjunctivae normal.     Pupils: Pupils are equal, round, and reactive to light.  Neck:     Thyroid: No thyroid mass or thyromegaly.  Cardiovascular:     Rate and Rhythm: Normal rate and regular rhythm.     Heart sounds: S1 normal and S2 normal. No murmur heard.    No friction rub. No S3 or S4 sounds.     Comments: No carotid bruits.  Carotid, radial, femoral, DP and PT pulses normal and equal.    Pulmonary:     Effort: Pulmonary effort is normal.     Breath sounds: Normal breath sounds and air entry.  Abdominal:     General: Bowel sounds are normal.     Palpations: Abdomen  is soft. There is no hepatomegaly, splenomegaly or mass.     Tenderness: There is no abdominal tenderness.     Hernia: No hernia is present. There is no hernia in the left inguinal area or right inguinal area.  Genitourinary:    Penis: Uncircumcised.      Testes:        Right: Mass or tenderness not present. Right testis is descended.        Left: Mass or tenderness not present. Left testis is descended.  Musculoskeletal:        General: Normal range of motion.     Cervical back: Normal range of motion and neck supple.     Right lower leg: No edema.      Left lower leg: No edema.  Feet:     Comments: Diabetic Foot Exam - Simple   Simple Foot Form Visual Inspection See comments: Yes Sensation Testing Intact to touch and monofilament testing bilaterally: Yes Pulse Check Posterior Tibialis and Dorsalis pulse intact bilaterally: Yes Comments Hammertoe formation of middle 3 toes bilaterally. Dry plantar surfaces.    Lymphadenopathy:     Head:     Right side of head: No submental or submandibular adenopathy.     Left side of head: No submental or submandibular adenopathy.     Cervical: No cervical adenopathy.     Upper Body:     Right upper body: No supraclavicular or axillary adenopathy.     Left upper body: No supraclavicular or axillary adenopathy.     Lower Body: No right inguinal adenopathy. No left inguinal adenopathy.  Skin:    General: Skin is warm.     Capillary Refill: Capillary refill takes less than 2 seconds.     Findings: No rash.  Neurological:     General: No focal deficit present.     Mental Status: He is alert and oriented to person, place, and time.     Cranial Nerves: Cranial nerves 2-12 are intact.     Sensory: Sensation is intact.     Motor: Motor function is intact.     Coordination: Coordination is intact.     Gait: Gait is intact.     Deep Tendon Reflexes: Reflexes are normal and symmetric.  Psychiatric:        Speech: Speech normal.        Behavior: Behavior normal. Behavior is cooperative.      Assessment & Plan  CPE Refuses FIT   2.   DM:  at goal with control.  3.  Hyperlipidemia:  LDL not at goal.  Increase Atorvastatin to 80 mg daily and repeat FLP/hepatic profile in 6 weeks.  4.  Diabetic peripheral neuropathy and chronic back pain:  was on low dose night time gabapentin, but stopped in last year at some point.  Restart gabapentin at 100 mg at bedtime and titrate to 300 mg with this single dose. Will consider PT for back pain in future, not now.  5.  Food allergies:  Epipen  refill.  6.  Chest pain:  being evaluated by Dr. Irish Lack.    7.  History of prostate CA:  as per urology.  8.  Hypertension:  better control.

## 2021-12-01 NOTE — Telephone Encounter (Signed)
Ivabradine not available at [facility] per pts wife request. Now sending rx for '10mg'$  ivabradine to [facility] for cash price  Pt and wife aware.  Marchia Bond RN Navigator Cardiac Imaging [facility] [phone] Office  [phone] Cell

## 2021-12-02 ENCOUNTER — Ambulatory Visit (HOSPITAL_BASED_OUTPATIENT_CLINIC_OR_DEPARTMENT_OTHER)
Admission: RE | Admit: 2021-12-02 | Discharge: 2021-12-02 | Disposition: A | Discharge status: Home / Self Care | Payer: Medicare Other | Source: Ambulatory Visit | Attending: Cardiology | Admitting: Cardiology

## 2021-12-02 ENCOUNTER — Ambulatory Visit (HOSPITAL_COMMUNITY)
Admission: RE | Admit: 2021-12-02 | Discharge: 2021-12-02 | Disposition: A | Discharge status: Home / Self Care | Payer: Medicare Other | Source: Ambulatory Visit | Attending: Cardiology | Admitting: Cardiology

## 2021-12-02 ENCOUNTER — Ambulatory Visit (HOSPITAL_COMMUNITY)
Admission: RE | Admit: 2021-12-02 | Discharge: 2021-12-02 | Disposition: A | Discharge status: Home / Self Care | Payer: Medicare Other | Source: Ambulatory Visit | Attending: Interventional Cardiology | Admitting: Interventional Cardiology

## 2021-12-02 ENCOUNTER — Telehealth: Payer: Self-pay | Admitting: *Deleted

## 2021-12-02 ENCOUNTER — Other Ambulatory Visit: Payer: Self-pay | Admitting: Cardiology

## 2021-12-02 DIAGNOSIS — R931 Abnormal findings on diagnostic imaging of heart and coronary circulation: Secondary | ICD-10-CM | POA: Diagnosis not present

## 2021-12-02 DIAGNOSIS — R072 Precordial pain: Secondary | ICD-10-CM | POA: Diagnosis present

## 2021-12-02 DIAGNOSIS — I2510 Atherosclerotic heart disease of native coronary artery without angina pectoris: Secondary | ICD-10-CM

## 2021-12-02 MED ORDER — NITROGLYCERIN 0.4 MG SL SUBL
0.8000 mg | SUBLINGUAL_TABLET | Freq: Once | SUBLINGUAL | Status: AC
  Administered 2021-12-02: 0.8 mg via SUBLINGUAL

## 2021-12-02 MED ORDER — NITROGLYCERIN 0.4 MG SL SUBL: 0.4000 mg | SUBLINGUAL_TABLET | SUBLINGUAL | 3 refills | Status: DC | PRN

## 2021-12-02 MED ORDER — ASPIRIN 81 MG PO TBEC: 81.0000 mg | DELAYED_RELEASE_TABLET | Freq: Every day | ORAL | 3 refills | Status: AC

## 2021-12-02 MED ORDER — IOHEXOL 350 MG/ML SOLN
100.0000 mL | Freq: Once | INTRAVENOUS | Status: AC | PRN
  Administered 2021-12-02: 100 mL via INTRAVENOUS

## 2021-12-02 MED ORDER — NITROGLYCERIN 0.4 MG SL SUBL
SUBLINGUAL_TABLET | SUBLINGUAL | Status: AC
  Filled 2021-12-02: qty 2

## 2021-12-02 NOTE — Telephone Encounter (Signed)
NTG sent to [facility] on [address]

## 2021-12-02 NOTE — Telephone Encounter (Signed)
-----   Message from Jettie Booze, MD sent at 12/02/2021  5:36 PM EDT ----- Severe multivessel CAD.  He will need a cath. I called the patient but voice mailbox was not set up. Please call in [facility] NTG.  Start aspirin 81 mg daily.  Wife answered the home phone so I explained the need for cardiac cath. She will pass along the results as the patient is unavailable.  She is expecting a call tomorrow to schedule the cath.

## 2021-12-03 NOTE — Telephone Encounter (Signed)
Patient has been scheduled for a heart cath on 8/18 with Dr. Irish Lack at 10:30am. Patient will need an updated H&P and EKG. I have scheduled him for an appointment on 8/15.  Labs completed 7/24.

## 2021-12-04 ENCOUNTER — Telehealth: Payer: Self-pay | Admitting: Interventional Cardiology

## 2021-12-04 ENCOUNTER — Encounter: Payer: Self-pay | Admitting: *Deleted

## 2021-12-04 NOTE — Telephone Encounter (Signed)
Cath changed to August 8 at 7:30.  I spoke with patient's wife and will send new instructions through my chart

## 2021-12-04 NOTE — Telephone Encounter (Signed)
Dr Irish Lack is in the cath lab on December 09, 2021 and there is availability that day for procedure.  I spoke with patient's wife and they would like to wait until December 19, 2021 as scheduled. Patient will not need appointment with Dr Irish Lack on December 16, 2021.  Patient's wife agreeable to canceling this appointment as she has spoken with Dr Irish Lack over the phone about cath.  EKG will be done at hospital. Dr Irish Lack will update H and P.   I verbally went over cath instructions with patient's wife and will send through my chart and mail copy to patient.  I went over how to use NTG with patient's wife.

## 2021-12-04 NOTE — Telephone Encounter (Signed)
Patient's wife called about his procedure, patient would like to change the date from 12/19/21 to 12/09/21. Patient would like to have it done as soon as possible.

## 2021-12-09 ENCOUNTER — Ambulatory Visit (HOSPITAL_COMMUNITY)
Admission: RE | Admit: 2021-12-09 | Discharge: 2021-12-09 | Disposition: A | Discharge status: Home / Self Care | Payer: Medicare Other | Attending: Interventional Cardiology | Admitting: Interventional Cardiology

## 2021-12-09 ENCOUNTER — Other Ambulatory Visit: Payer: Self-pay

## 2021-12-09 ENCOUNTER — Encounter (HOSPITAL_COMMUNITY): Payer: Self-pay | Admitting: Interventional Cardiology

## 2021-12-09 ENCOUNTER — Encounter (HOSPITAL_COMMUNITY)
Admission: RE | Disposition: A | Discharge status: Home / Self Care | Payer: Self-pay | Source: Home / Self Care | Attending: Interventional Cardiology

## 2021-12-09 DIAGNOSIS — I700 Atherosclerosis of aorta: Secondary | ICD-10-CM | POA: Diagnosis not present

## 2021-12-09 DIAGNOSIS — R931 Abnormal findings on diagnostic imaging of heart and coronary circulation: Secondary | ICD-10-CM | POA: Diagnosis present

## 2021-12-09 DIAGNOSIS — Z7984 Long term (current) use of oral hypoglycemic drugs: Secondary | ICD-10-CM | POA: Insufficient documentation

## 2021-12-09 DIAGNOSIS — Z79899 Other long term (current) drug therapy: Secondary | ICD-10-CM | POA: Diagnosis not present

## 2021-12-09 DIAGNOSIS — I2510 Atherosclerotic heart disease of native coronary artery without angina pectoris: Secondary | ICD-10-CM | POA: Diagnosis not present

## 2021-12-09 DIAGNOSIS — I10 Essential (primary) hypertension: Secondary | ICD-10-CM | POA: Insufficient documentation

## 2021-12-09 DIAGNOSIS — E119 Type 2 diabetes mellitus without complications: Secondary | ICD-10-CM | POA: Diagnosis not present

## 2021-12-09 DIAGNOSIS — E785 Hyperlipidemia, unspecified: Secondary | ICD-10-CM | POA: Diagnosis not present

## 2021-12-09 HISTORY — PX: LEFT HEART CATH AND CORONARY ANGIOGRAPHY: CATH118249

## 2021-12-09 LAB — GLUCOSE, CAPILLARY: Glucose-Capillary: 133 mg/dL — ABNORMAL HIGH (ref 70–99)

## 2021-12-09 SURGERY — LEFT HEART CATH AND CORONARY ANGIOGRAPHY
Anesthesia: LOCAL

## 2021-12-09 MED ORDER — SODIUM CHLORIDE 0.9 % WEIGHT BASED INFUSION
3.0000 mL/kg/h | INTRAVENOUS | Status: AC
  Administered 2021-12-09: 3 mL/kg/h via INTRAVENOUS

## 2021-12-09 MED ORDER — MIDAZOLAM HCL 2 MG/2ML IJ SOLN
INTRAMUSCULAR | Status: DC | PRN
  Administered 2021-12-09: 2 mg via INTRAVENOUS

## 2021-12-09 MED ORDER — VERAPAMIL HCL 2.5 MG/ML IV SOLN
INTRAVENOUS | Status: DC | PRN
  Administered 2021-12-09 (×2): 10 mL via INTRA_ARTERIAL

## 2021-12-09 MED ORDER — HEPARIN SODIUM (PORCINE) 1000 UNIT/ML IJ SOLN
INTRAMUSCULAR | Status: AC
Start: 2021-12-09 — End: ?
  Filled 2021-12-09: qty 10

## 2021-12-09 MED ORDER — IOHEXOL 350 MG/ML SOLN
INTRAVENOUS | Status: DC | PRN
  Administered 2021-12-09: 65 mL

## 2021-12-09 MED ORDER — SODIUM CHLORIDE 0.9 % WEIGHT BASED INFUSION
1.0000 mL/kg/h | INTRAVENOUS | Status: DC
Start: 2021-12-09 — End: 2021-12-09

## 2021-12-09 MED ORDER — FENTANYL CITRATE (PF) 100 MCG/2ML IJ SOLN
INTRAMUSCULAR | Status: DC | PRN
  Administered 2021-12-09: 25 ug via INTRAVENOUS

## 2021-12-09 MED ORDER — SODIUM CHLORIDE 0.9% FLUSH: 3.0000 mL | INTRAVENOUS | Status: DC | PRN

## 2021-12-09 MED ORDER — SODIUM CHLORIDE 0.9% FLUSH
3.0000 mL | Freq: Two times a day (BID) | INTRAVENOUS | Status: DC
Start: 2021-12-09 — End: 2021-12-09

## 2021-12-09 MED ORDER — LIDOCAINE HCL (PF) 1 % IJ SOLN
INTRAMUSCULAR | Status: DC | PRN
  Administered 2021-12-09: 2 mL via INTRADERMAL

## 2021-12-09 MED ORDER — METFORMIN HCL 500 MG PO TABS: 500.0000 mg | ORAL_TABLET | Freq: Two times a day (BID) | ORAL | 6 refills | Status: DC

## 2021-12-09 MED ORDER — VERAPAMIL HCL 2.5 MG/ML IV SOLN
INTRAVENOUS | Status: AC
  Filled 2021-12-09: qty 2

## 2021-12-09 MED ORDER — HYDRALAZINE HCL 20 MG/ML IJ SOLN: 10.0000 mg | INTRAMUSCULAR | Status: DC | PRN

## 2021-12-09 MED ORDER — ONDANSETRON HCL 4 MG/2ML IJ SOLN: 4.0000 mg | Freq: Four times a day (QID) | INTRAMUSCULAR | Status: DC | PRN

## 2021-12-09 MED ORDER — ASPIRIN 81 MG PO CHEW
81.0000 mg | CHEWABLE_TABLET | ORAL | Status: AC
  Administered 2021-12-09: 81 mg via ORAL
  Filled 2021-12-09: qty 1

## 2021-12-09 MED ORDER — LIDOCAINE HCL (PF) 1 % IJ SOLN
INTRAMUSCULAR | Status: AC
  Filled 2021-12-09: qty 30

## 2021-12-09 MED ORDER — SODIUM CHLORIDE 0.9% FLUSH: 3.0000 mL | Freq: Two times a day (BID) | INTRAVENOUS | Status: DC

## 2021-12-09 MED ORDER — HEPARIN (PORCINE) IN NACL 1000-0.9 UT/500ML-% IV SOLN
INTRAVENOUS | Status: DC | PRN
  Administered 2021-12-09 (×2): 500 mL

## 2021-12-09 MED ORDER — SODIUM CHLORIDE 0.9 % IV SOLN: 250.0000 mL | INTRAVENOUS | Status: DC | PRN

## 2021-12-09 MED ORDER — LABETALOL HCL 5 MG/ML IV SOLN: 10.0000 mg | INTRAVENOUS | Status: DC | PRN

## 2021-12-09 MED ORDER — SODIUM CHLORIDE 0.9 % IV SOLN: INTRAVENOUS | Status: AC

## 2021-12-09 MED ORDER — MIDAZOLAM HCL 2 MG/2ML IJ SOLN
INTRAMUSCULAR | Status: AC
  Filled 2021-12-09: qty 2

## 2021-12-09 MED ORDER — HEPARIN SODIUM (PORCINE) 1000 UNIT/ML IJ SOLN
INTRAMUSCULAR | Status: DC | PRN
  Administered 2021-12-09: 4500 [IU] via INTRAVENOUS

## 2021-12-09 MED ORDER — FENTANYL CITRATE (PF) 100 MCG/2ML IJ SOLN
INTRAMUSCULAR | Status: AC
  Filled 2021-12-09: qty 2

## 2021-12-09 MED ORDER — HEPARIN (PORCINE) IN NACL 1000-0.9 UT/500ML-% IV SOLN
INTRAVENOUS | Status: AC
  Filled 2021-12-09: qty 1000

## 2021-12-09 MED ORDER — ACETAMINOPHEN 325 MG PO TABS: 650.0000 mg | ORAL_TABLET | ORAL | Status: DC | PRN

## 2021-12-09 SURGICAL SUPPLY — 10 items
BAND ZEPHYR COMPRESS 30 LONG (HEMOSTASIS) ×1 IMPLANT
CATH 5FR JL3.5 JR4 ANG PIG MP (CATHETERS) ×1 IMPLANT
GLIDESHEATH SLEND SS 6F .021 (SHEATH) ×1 IMPLANT
GUIDEWIRE INQWIRE 1.5J.035X260 (WIRE) IMPLANT
INQWIRE 1.5J .035X260CM (WIRE) ×2
KIT HEART LEFT (KITS) ×2 IMPLANT
PACK CARDIAC CATHETERIZATION (CUSTOM PROCEDURE TRAY) ×2 IMPLANT
SHEATH PROBE COVER 6X72 (BAG) ×1 IMPLANT
TRANSDUCER W/STOPCOCK (MISCELLANEOUS) ×2 IMPLANT
TUBING CIL FLEX 10 FLL-RA (TUBING) ×2 IMPLANT

## 2021-12-09 NOTE — Interval H&P Note (Signed)
Cath Lab Visit (complete for each Cath Lab visit)  Clinical Evaluation Leading to the Procedure:   ACS: No.  Non-ACS:    Anginal Classification: CCS II  Anti-ischemic medical therapy: Minimal Therapy (1 class of medications)  Non-Invasive Test Results: High-risk stress test findings: cardiac mortality >3%/year  Prior CABG: No previous CABG   Severe prox LAD disease noted on CTA   History and Physical Interval Note:  12/09/2021 7:35 AM  Spencer Walton.  has presented today for surgery, with the diagnosis of cad.  The various methods of treatment have been discussed with the patient and family. After consideration of risks, benefits and other options for treatment, the patient has consented to  Procedure(s): LEFT HEART CATH AND CORONARY ANGIOGRAPHY (N/A) as a surgical intervention.  The patient's history has been reviewed, patient examined, no change in status, stable for surgery.  I have reviewed the patient's chart and labs.  Questions were answered to the patient's satisfaction.     Larae Grooms

## 2021-12-16 ENCOUNTER — Ambulatory Visit: Payer: Medicare Other | Admitting: Interventional Cardiology

## 2021-12-18 ENCOUNTER — Ambulatory Visit (INDEPENDENT_AMBULATORY_CARE_PROVIDER_SITE_OTHER): Payer: Medicare Other | Admitting: Internal Medicine

## 2021-12-18 DIAGNOSIS — Z23 Encounter for immunization: Secondary | ICD-10-CM | POA: Diagnosis not present

## 2022-01-19 ENCOUNTER — Ambulatory Visit: Payer: Medicare Other | Admitting: Interventional Cardiology

## 2022-01-21 ENCOUNTER — Ambulatory Visit: Payer: Medicare Other | Admitting: Interventional Cardiology

## 2022-01-22 ENCOUNTER — Ambulatory Visit: Payer: Medicare Other | Admitting: Interventional Cardiology

## 2022-01-23 ENCOUNTER — Other Ambulatory Visit: Payer: Self-pay | Admitting: Internal Medicine

## 2022-01-26 ENCOUNTER — Other Ambulatory Visit (INDEPENDENT_AMBULATORY_CARE_PROVIDER_SITE_OTHER): Payer: Medicare Other

## 2022-01-26 DIAGNOSIS — E782 Mixed hyperlipidemia: Secondary | ICD-10-CM | POA: Diagnosis not present

## 2022-01-27 LAB — HEPATIC FUNCTION PANEL
ALT: 18 IU/L (ref 0–44)
AST: 17 IU/L (ref 0–40)
Albumin: 4.8 g/dL (ref 3.9–4.9)
Alkaline Phosphatase: 69 IU/L (ref 44–121)
Bilirubin Total: 0.6 mg/dL (ref 0.0–1.2)
Bilirubin, Direct: 0.17 mg/dL (ref 0.00–0.40)
Total Protein: 7.1 g/dL (ref 6.0–8.5)

## 2022-01-27 LAB — LIPID PANEL W/O CHOL/HDL RATIO
Cholesterol, Total: 134 mg/dL (ref 100–199)
HDL: 47 mg/dL (ref >39)
LDL Chol Calc (NIH): 68 mg/dL (ref 0–99)
Triglycerides: 106 mg/dL (ref 0–149)
VLDL Cholesterol Cal: 19 mg/dL (ref 5–40)

## 2022-01-28 ENCOUNTER — Ambulatory Visit (INDEPENDENT_AMBULATORY_CARE_PROVIDER_SITE_OTHER): Payer: Medicare Other | Admitting: Internal Medicine

## 2022-01-28 ENCOUNTER — Encounter: Payer: Self-pay | Admitting: Internal Medicine

## 2022-01-28 VITALS — BP 138/90 | HR 84 | Resp 16 | Ht 71.0 in | Wt 204.0 lb

## 2022-01-28 DIAGNOSIS — Z23 Encounter for immunization: Secondary | ICD-10-CM

## 2022-01-28 DIAGNOSIS — I10 Essential (primary) hypertension: Secondary | ICD-10-CM

## 2022-01-28 DIAGNOSIS — E782 Mixed hyperlipidemia: Secondary | ICD-10-CM | POA: Diagnosis not present

## 2022-01-28 DIAGNOSIS — E118 Type 2 diabetes mellitus with unspecified complications: Secondary | ICD-10-CM | POA: Diagnosis not present

## 2022-01-28 NOTE — Progress Notes (Signed)
Subjective:    Patient ID: Spencer Walton., male   DOB: August 17, 1955, 66 y.o.   MRN: [id number]   HPI   Hypertension:  Ran out of HCTZ a couple of days ago.  Filled from our office this morning.  2.  Hyperlipidemia:  LDL at 68 on high dose Atorvastatin.  Recent Cath did not show a significant obstructive lesion of coronary arteries as suspected from Coronary CT.   3.  Chest pain:  no longer with any chest discomfort.  He is mowing lawns regularly without CP as well.     Current Meds  Medication Sig   aspirin EC 81 MG tablet Take 1 tablet (81 mg total) by mouth daily. Swallow whole.   atorvastatin (LIPITOR) 80 MG tablet TAKE 1 TABLET BY MOUTH ONCE DAILY WITH EVENING MEAL   baclofen (LIORESAL) 10 MG tablet Take 0.5 tablets (5 mg total) by mouth 2 (two) times daily.   EPINEPHrine (EPIPEN 2-PAK) 0.3 mg/0.3 mL IJ SOAJ injection Inject 0.3 mg into the muscle as needed for anaphylaxis.   gabapentin (NEURONTIN) 100 MG capsule 1 cap by mouth at bedtime daily to increase to 2 caps in 3 days and increase to 3 caps in 3 days and remain on that dose (Patient taking differently: 1 cap by mouth at bedtime daily to increase to 2 caps in 3 days and increase to 3 caps in 3 days and remain on that dose. As needed)   hydrochlorothiazide (HYDRODIURIL) 25 MG tablet TAKE 1 TABLET BY MOUTH ONCE DAILY IN THE MORNING WITH  LOSARTAN   losartan (COZAAR) 50 MG tablet Take 2 tablets by mouth once daily   metFORMIN (GLUCOPHAGE) 500 MG tablet Take 1 tablet (500 mg total) by mouth 2 (two) times daily with a meal.   nitroGLYCERIN (NITROSTAT) 0.4 MG SL tablet Place 1 tablet (0.4 mg total) under the tongue every 5 (five) minutes as needed for chest pain.   Allergies  Allergen Reactions   Cherry Extract Anaphylaxis, Hives, Swelling and Rash   Fruit & Vegetable Daily [Nutritional Supplements] Anaphylaxis, Hives, Swelling and Rash    Fruit cocktail: Lips swell   Other Anaphylaxis and Swelling    NO NUTS!!!!   Peach  [Prunus Persica] Anaphylaxis and Swelling   Pecan Nut (Diagnostic) Shortness Of Breath and Swelling    Throat and tongue swelling   Penicillins Anaphylaxis and Swelling    Has patient had a PCN reaction causing immediate rash, facial/tongue/throat swelling, SOB or lightheadedness with hypotension: Yes Has patient had a PCN reaction causing severe rash involving mucus membranes or skin necrosis: No Has patient had a PCN reaction that required hospitalization: No Has patient had a PCN reaction occurring within the last 10 years: No If all of the above answers are "NO", then may proceed with Cephalosporin use.    Pineapple Anaphylaxis, Hives and Swelling   Lisinopril Cough     Review of Systems    Objective:   BP (!) 138/90 (BP Location: Right Arm, Patient Position: Sitting, Cuff Size: Normal)   Pulse 84   Resp 16   Ht '5\' 11"'$  (1.803 m)   Wt 204 lb (92.5 kg)   BMI 28.45 kg/m   Physical Exam NAD HEENT:  PERRL, EOMI, Neck:  Supple, No adenoapthy, no thyromegaly Chest:  CTA CV:  RRR with normal S1 and S2, No S3, S4 or murmur.  No carotid bruits.  Carotid, radial and DP pulses normal and equal Abd:  S, NT, No HSM or  mass, + BS LE:  No edema   Assessment & Plan    Hypertension:  BP not at goal today, but out of HCTZ for 2 days.  2.  Chest pain:  likely noncardiac as cath did not show significant coronary artery stenosis.  He no longer is having CP.  Follow and improve risk factors.    3.  Hyperlipidemia:  LDL at goal.    4.  DM:  A1C has been at goal--last checked end of July.  5.  HM:  Influenza and Shingrix vaccines.

## 2022-02-04 ENCOUNTER — Ambulatory Visit: Payer: Medicare Other | Admitting: Podiatry

## 2022-02-11 NOTE — Progress Notes (Deleted)
Cardiology Office Note   Date:  02/11/2022   ID:  Spencer Spring., DOB 1955-12-14, MRN [id number]  PCP:  [name], MD    No chief complaint on file.  CAD  Wt Readings from Last 3 Encounters:  01/28/22 204 lb (92.5 kg)  12/09/21 200 lb (90.7 kg)  12/01/21 203 lb (92.1 kg)       History of Present Illness: Spencer Walton. is a 66 y.o. male  with mild CAD.  Cath in 8/23 showed: "RCA originates from the mid LAD- unable to show this in the diagram.   Mid LAD lesion is 25% stenosed.   Prox Cx lesion is 25% stenosed.   1st Diag lesion is 50% stenosed. Small vessel.   The left ventricular systolic function is normal.   LV end diastolic pressure is normal.   The left ventricular ejection fraction is 55-65% by visual estimate.   There is no aortic valve stenosis.   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   Nonobstructive disease noted in the mid LAD.  Despite the high risk CTA, his cardiac cath is consistent with his lack of exertional symptoms recently.  Continue medical therapy.  Hold metformin for 48 hours."    Past Medical History:  Diagnosis Date   ACE-inhibitor cough 2016   Anxiety    Arthritis    Cancer ([place])    prostate; Gleason score 7.  Confined to prostate.  Lymph nodes negative.   Diabetes mellitus without complication ([place]) 78/5885   A1C was 10.3%   Elevated PSA, less than 10 ng/ml 01/22/2020   Free PSA 0.53   Food allergy, peanut 07/21/2018   Also pecan   Hyperlipidemia    Hypertension 11/2014   Left inguinal hernia    Low back pain    History of back injury as a teenager in [place].  States he had a fractured vertebrae.  Had ORIF.   Microalbuminuria 11/2014    Past Surgical History:  Procedure Laterality Date   Banding of internal hemorrhoids  1996   Extensive hand surgery  1977   pinning of fracture 4th Metacarpal fracture   Foreign body removal right hand   2006   HERNIA REPAIR Right 1999   x 3   INGUINAL HERNIA REPAIR Left 10/18/2018   Procedure: LEFT INGUINAL HERNIA REPAIR WITH MESH;  Surgeon: Donnie Mesa, MD;  Location: [facility];  Service: General;  Laterality: Left;   LEFT HEART CATH AND CORONARY ANGIOGRAPHY N/A 12/09/2021   Procedure: LEFT HEART CATH AND CORONARY ANGIOGRAPHY;  Surgeon: Jettie Booze, MD;  Location: [facility];  Service: Cardiovascular;  Laterality: N/A;   LYMPHADENECTOMY Bilateral 07/05/2020   Procedure: LYMPHADENECTOMY, PELVIC;  Surgeon: Janith Lima, MD;  [facility];  Service: Urology;  Laterality: Bilateral;   NOSE SURGERY  1998   ORIF vertebral fracture  1975   ROBOT ASSISTED LAPAROSCOPIC RADICAL PROSTATECTOMY N/A 07/05/2020   Procedure: XI ROBOTIC ASSISTED LAPAROSCOPIC RADICAL PROSTATECTOMY;  Surgeon: Janith Lima, MD;  [facility];  Service: Urology;  Laterality: N/A;  ONLY NEEDS 240 MIN FOR ALL PROCEDURES   UMBILICAL HERNIA REPAIR N/A 10/18/2018   Procedure: UMBILICAL HERNIA REPAIR WITH MESH;  Surgeon: Donnie Mesa, MD;  Location: [facility];  Service: General;  Laterality: N/A;     Current Outpatient Medications  Medication Sig Dispense Refill   aspirin EC  81 MG tablet Take 1 tablet (81 mg total) by mouth daily. Swallow whole. 90 tablet 3   atorvastatin (LIPITOR) 80 MG tablet TAKE 1 TABLET BY MOUTH ONCE DAILY WITH EVENING MEAL 90 tablet 11   baclofen (LIORESAL) 10 MG tablet Take 0.5 tablets (5 mg total) by mouth 2 (two) times daily. 30 each 0   diclofenac Sodium (VOLTAREN) 1 % GEL Apply 2 to 4 g to affected areas up to twice daily. (Patient not taking: Reported on 12/05/2021) 350 g 6   EPINEPHrine (EPIPEN 2-PAK) 0.3 mg/0.3 mL IJ SOAJ injection Inject 0.3 mg into the muscle as needed for anaphylaxis. 1 each 1   gabapentin (NEURONTIN) 100 MG capsule 1 cap by mouth at bedtime daily to increase to 2 caps in 3 days and increase to 3 caps in 3 days and remain on  that dose (Patient taking differently: 1 cap by mouth at bedtime daily to increase to 2 caps in 3 days and increase to 3 caps in 3 days and remain on that dose. As needed) 90 capsule 11   hydrochlorothiazide (HYDRODIURIL) 25 MG tablet TAKE 1 TABLET BY MOUTH ONCE DAILY IN THE MORNING WITH  LOSARTAN 30 tablet 10   losartan (COZAAR) 50 MG tablet Take 2 tablets by mouth once daily 60 tablet 10   metFORMIN (GLUCOPHAGE) 500 MG tablet Take 1 tablet (500 mg total) by mouth 2 (two) times daily with a meal. 60 tablet 6   nitroGLYCERIN (NITROSTAT) 0.4 MG SL tablet Place 1 tablet (0.4 mg total) under the tongue every 5 (five) minutes as needed for chest pain. 25 tablet 3   traZODone (DESYREL) 50 MG tablet Take 0.5-1 tablets (25-50 mg total) by mouth at bedtime as needed for sleep. (Patient not taking: Reported on 12/05/2021) 30 tablet 11   No current facility-administered medications for this visit.    Allergies:   Cherry extract, Fruit & vegetable daily [nutritional supplements], Other, Peach [prunus persica], Pecan nut (diagnostic), Penicillins, Pineapple, and Lisinopril    Social History:  The patient  reports that he has never smoked. He has never been exposed to tobacco smoke. He has never used smokeless tobacco. He reports that he does not currently use alcohol. He reports that he does not use drugs.   Family History:  The patient's ***family history includes Cancer in his brother, mother, and sister; Colon cancer (age of onset: 50) in his mother; Colon polyps in his sister; Gallstones in his daughter; Heart disease in his brother and father; Heart disease (age of onset: 63) in his brother; Hypertension in his mother; Multiple sclerosis in his brother; Seizures in his father and sister.    ROS:  Please see the history of present illness.   Otherwise, review of systems are positive for ***.   All other systems are reviewed and negative.    PHYSICAL EXAM: VS:  There were no vitals taken for this visit. ,  BMI There is no height or weight on file to calculate BMI. GEN: Well nourished, well developed, in no acute distress HEENT: normal Neck: no JVD, carotid bruits, or masses Cardiac: ***RRR; no murmurs, rubs, or gallops,no edema  Respiratory:  clear to auscultation bilaterally, normal work of breathing GI: soft, nontender, nondistended, + BS MS: no deformity or atrophy Skin: warm and dry, no rash Neuro:  Strength and sensation are intact Psych: euthymic mood, full affect   EKG:   The ekg ordered today demonstrates ***   Recent Labs: 11/24/2021: BUN 14; Creatinine, Ser  1.12; Hemoglobin 14.2; Platelets 230; Potassium 4.1; Sodium 140 01/26/2022: ALT 18   Lipid Panel    Component Value Date/Time   CHOL 134 01/26/2022 1910   TRIG 106 01/26/2022 1910   HDL 47 01/26/2022 1910   LDLCALC 68 01/26/2022 1910     Other studies Reviewed: Additional studies/ records that were reviewed today with results demonstrating: ***.   ASSESSMENT AND PLAN:  CAD:  HTN: Hyperlipidemia: DM: Aortic atherosclerosis:   Current medicines are reviewed at length with the patient today.  The patient concerns regarding his medicines were addressed.  The following changes have been made:  No change***  Labs/ tests ordered today include: *** No orders of the defined types were placed in this encounter.   Recommend 150 minutes/week of aerobic exercise Low fat, low carb, high fiber diet recommended  Disposition:   FU in ***   Signed, Larae Grooms, MD  02/11/2022 10:45 PM    [facility] [place], [place],   [address] Phone: [phone]; Fax: [phone]

## 2022-02-13 ENCOUNTER — Ambulatory Visit: Payer: Medicare Other | Admitting: Interventional Cardiology

## 2022-02-18 DIAGNOSIS — F419 Anxiety disorder, unspecified: Secondary | ICD-10-CM | POA: Insufficient documentation

## 2022-02-18 DIAGNOSIS — G4709 Other insomnia: Secondary | ICD-10-CM | POA: Insufficient documentation

## 2022-02-18 DIAGNOSIS — G5601 Carpal tunnel syndrome, right upper limb: Secondary | ICD-10-CM | POA: Insufficient documentation

## 2022-02-18 DIAGNOSIS — G8929 Other chronic pain: Secondary | ICD-10-CM | POA: Insufficient documentation

## 2022-03-06 ENCOUNTER — Other Ambulatory Visit: Payer: Self-pay | Admitting: Internal Medicine

## 2022-03-09 NOTE — Progress Notes (Deleted)
Cardiology Office Note   Date:  03/09/2022   ID:  Spencer Spring., DOB Jun 30, 1955, MRN [id number]  PCP:  [name], MD    No chief complaint on file.  CAD  Wt Readings from Last 3 Encounters:  01/28/22 204 lb (92.5 kg)  12/09/21 200 lb (90.7 kg)  12/01/21 203 lb (92.1 kg)       History of Present Illness: Spencer Valdez. is a 66 y.o. male  with mild CAD.  Cath in 8/23 showed: "RCA originates from the mid LAD- unable to show this in the diagram.   Mid LAD lesion is 25% stenosed.   Prox Cx lesion is 25% stenosed.   1st Diag lesion is 50% stenosed. Small vessel.   The left ventricular systolic function is normal.   LV end diastolic pressure is normal.   The left ventricular ejection fraction is 55-65% by visual estimate.   There is no aortic valve stenosis.   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   Nonobstructive disease noted in the mid LAD.  Despite the high risk CTA, his cardiac cath is consistent with his lack of exertional symptoms recently.  Continue medical therapy.  Hold metformin for 48 hours."    Past Medical History:  Diagnosis Date   ACE-inhibitor cough 2016   Anxiety    Arthritis    Cancer ([place])    prostate; Gleason score 7.  Confined to prostate.  Lymph nodes negative.   Diabetes mellitus without complication ([place]) 01/7352   A1C was 10.3%   Elevated PSA, less than 10 ng/ml 01/22/2020   Free PSA 0.53   Food allergy, peanut 07/21/2018   Also pecan   Hyperlipidemia    Hypertension 11/2014   Left inguinal hernia    Low back pain    History of back injury as a teenager in [place].  States he had a fractured vertebrae.  Had ORIF.   Microalbuminuria 11/2014    Past Surgical History:  Procedure Laterality Date   Banding of internal hemorrhoids  1996   Extensive hand surgery  1977   pinning of fracture 4th Metacarpal fracture   Foreign body removal right  hand  2006   HERNIA REPAIR Right 1999   x 3   INGUINAL HERNIA REPAIR Left 10/18/2018   Procedure: LEFT INGUINAL HERNIA REPAIR WITH MESH;  Surgeon: Donnie Mesa, MD;  Location: [facility];  Service: General;  Laterality: Left;   LEFT HEART CATH AND CORONARY ANGIOGRAPHY N/A 12/09/2021   Procedure: LEFT HEART CATH AND CORONARY ANGIOGRAPHY;  Surgeon: Jettie Booze, MD;  Location: [facility];  Service: Cardiovascular;  Laterality: N/A;   LYMPHADENECTOMY Bilateral 07/05/2020   Procedure: LYMPHADENECTOMY, PELVIC;  Surgeon: Janith Lima, MD;  [facility];  Service: Urology;  Laterality: Bilateral;   NOSE SURGERY  1998   ORIF vertebral fracture  1975   ROBOT ASSISTED LAPAROSCOPIC RADICAL PROSTATECTOMY N/A 07/05/2020   Procedure: XI ROBOTIC ASSISTED LAPAROSCOPIC RADICAL PROSTATECTOMY;  Surgeon: Janith Lima, MD;  [facility];  Service: Urology;  Laterality: N/A;  ONLY NEEDS 240 MIN FOR ALL PROCEDURES   UMBILICAL HERNIA REPAIR N/A 10/18/2018   Procedure: UMBILICAL HERNIA REPAIR WITH MESH;  Surgeon: Donnie Mesa, MD;  Location: [facility];  Service: General;  Laterality: N/A;     Current Outpatient Medications  Medication Sig Dispense Refill  aspirin EC 81 MG tablet Take 1 tablet (81 mg total) by mouth daily. Swallow whole. 90 tablet 3   atorvastatin (LIPITOR) 80 MG tablet TAKE 1 TABLET BY MOUTH ONCE DAILY WITH EVENING MEAL 90 tablet 11   baclofen (LIORESAL) 10 MG tablet Take 0.5 tablets (5 mg total) by mouth 2 (two) times daily. 30 each 0   diclofenac Sodium (VOLTAREN) 1 % GEL Apply 2 to 4 g to affected areas up to twice daily. (Patient not taking: Reported on 12/05/2021) 350 g 6   EPINEPHrine (EPIPEN 2-PAK) 0.3 mg/0.3 mL IJ SOAJ injection Inject 0.3 mg into the muscle as needed for anaphylaxis. 1 each 1   gabapentin (NEURONTIN) 100 MG capsule 1 cap by mouth at bedtime daily to increase to 2 caps in 3 days and increase to 3 caps in 3 days and remain  on that dose (Patient taking differently: 1 cap by mouth at bedtime daily to increase to 2 caps in 3 days and increase to 3 caps in 3 days and remain on that dose. As needed) 90 capsule 11   hydrochlorothiazide (HYDRODIURIL) 25 MG tablet TAKE 1 TABLET BY MOUTH ONCE DAILY IN THE MORNING WITH  LOSARTAN 30 tablet 10   losartan (COZAAR) 50 MG tablet Take 2 tablets by mouth once daily 60 tablet 10   metFORMIN (GLUCOPHAGE) 500 MG tablet TAKE 1 TABLET BY MOUTH TWICE DAILY WITH A MEAL 60 tablet 10   nitroGLYCERIN (NITROSTAT) 0.4 MG SL tablet Place 1 tablet (0.4 mg total) under the tongue every 5 (five) minutes as needed for chest pain. 25 tablet 3   traZODone (DESYREL) 50 MG tablet Take 0.5-1 tablets (25-50 mg total) by mouth at bedtime as needed for sleep. (Patient not taking: Reported on 12/05/2021) 30 tablet 11   No current facility-administered medications for this visit.    Allergies:   Cherry extract, Fruit & vegetable daily [nutritional supplements], Other, Peach [prunus persica], Pecan nut (diagnostic), Penicillins, Pineapple, and Lisinopril    Social History:  The patient  reports that he has never smoked. He has never been exposed to tobacco smoke. He has never used smokeless tobacco. He reports that he does not currently use alcohol. He reports that he does not use drugs.   Family History:  The patient's ***family history includes Cancer in his brother, mother, and sister; Colon cancer (age of onset: 39) in his mother; Colon polyps in his sister; Gallstones in his daughter; Heart disease in his brother and father; Heart disease (age of onset: 22) in his brother; Hypertension in his mother; Multiple sclerosis in his brother; Seizures in his father and sister.    ROS:  Please see the history of present illness.   Otherwise, review of systems are positive for ***.   All other systems are reviewed and negative.    PHYSICAL EXAM: VS:  There were no vitals taken for this visit. , BMI There is no  height or weight on file to calculate BMI. GEN: Well nourished, well developed, in no acute distress HEENT: normal Neck: no JVD, carotid bruits, or masses Cardiac: ***RRR; no murmurs, rubs, or gallops,no edema  Respiratory:  clear to auscultation bilaterally, normal work of breathing GI: soft, nontender, nondistended, + BS MS: no deformity or atrophy Skin: warm and dry, no rash Neuro:  Strength and sensation are intact Psych: euthymic mood, full affect   EKG:   The ekg ordered today demonstrates ***   Recent Labs: 11/24/2021: BUN 14; Creatinine, Ser 1.12; Hemoglobin 14.2;  Platelets 230; Potassium 4.1; Sodium 140 01/26/2022: ALT 18   Lipid Panel    Component Value Date/Time   CHOL 134 01/26/2022 1910   TRIG 106 01/26/2022 1910   HDL 47 01/26/2022 1910   LDLCALC 68 01/26/2022 1910     Other studies Reviewed: Additional studies/ records that were reviewed today with results demonstrating: ***.   ASSESSMENT AND PLAN:  CAD:  HTN: Hyperlipidemia: DM: Aortic atherosclerosis:   Current medicines are reviewed at length with the patient today.  The patient concerns regarding his medicines were addressed.  The following changes have been made:  No change***  Labs/ tests ordered today include: *** No orders of the defined types were placed in this encounter.   Recommend 150 minutes/week of aerobic exercise Low fat, low carb, high fiber diet recommended  Disposition:   FU in ***   Signed, Larae Grooms, MD  03/09/2022 7:58 PM    [facility] [place], [place], [place]  [address] Phone: [phone]; Fax: [phone]

## 2022-03-10 ENCOUNTER — Ambulatory Visit: Payer: Medicare Other | Admitting: Interventional Cardiology

## 2022-03-10 ENCOUNTER — Encounter: Payer: Self-pay | Admitting: Podiatry

## 2022-03-10 ENCOUNTER — Ambulatory Visit (INDEPENDENT_AMBULATORY_CARE_PROVIDER_SITE_OTHER): Payer: Medicare Other | Admitting: Podiatry

## 2022-03-10 DIAGNOSIS — I700 Atherosclerosis of aorta: Secondary | ICD-10-CM

## 2022-03-10 DIAGNOSIS — E1142 Type 2 diabetes mellitus with diabetic polyneuropathy: Secondary | ICD-10-CM | POA: Diagnosis not present

## 2022-03-10 DIAGNOSIS — I10 Essential (primary) hypertension: Secondary | ICD-10-CM

## 2022-03-10 DIAGNOSIS — E782 Mixed hyperlipidemia: Secondary | ICD-10-CM

## 2022-03-10 DIAGNOSIS — B351 Tinea unguium: Secondary | ICD-10-CM | POA: Diagnosis not present

## 2022-03-10 DIAGNOSIS — E119 Type 2 diabetes mellitus without complications: Secondary | ICD-10-CM

## 2022-03-10 DIAGNOSIS — M79675 Pain in left toe(s): Secondary | ICD-10-CM | POA: Diagnosis not present

## 2022-03-10 DIAGNOSIS — R931 Abnormal findings on diagnostic imaging of heart and coronary circulation: Secondary | ICD-10-CM

## 2022-03-10 DIAGNOSIS — M79674 Pain in right toe(s): Secondary | ICD-10-CM

## 2022-03-10 NOTE — Progress Notes (Signed)
This patient presents the office for treatment of his long painful toenails both feet.  Patient states that he has back problems and he is unable to self treat his nails.  Patient states the he is diabetic..  He presents the office today for  nail care.  General Appearance  Alert, conversant and in no acute stress.  Vascular  Dorsalis pedis and posterior tibial  pulses are  weakly  palpable  bilaterally.  Capillary return is within normal limits  bilaterally. Temperature is within normal limits  bilaterally.  Neurologic  Senn-Weinstein monofilament wire test within normal limits  bilaterally. Muscle power within normal limits bilaterally.  Nails Thick disfigured discolored nails with subungual debris  from hallux to fifth toes bilaterally.  Orthopedic  No limitations of motion  feet .  No crepitus or effusions noted.  No bony pathology or digital deformities noted. Hammer toe 2-5  B/L.  Hallux malleus  B/L  Skin  normotropic skin with no porokeratosis noted bilaterally.  No signs of infections or ulcers noted.    Onychomycosis  X 10.  Diabetes  ROV.  Debride nails.  Discussed neuropathy with this patient RTC 3 months.    Abdo Denault DPM 

## 2022-03-23 IMAGING — DX DG CHEST 2V
2 series · 2 of 2 positions shown · non-contrast
Comparison: 07/21/2018

CLINICAL DATA: History of COVID several weeks ago with persistent
cough, initial encounter

EXAM:
CHEST - 2 VIEW

[chest pa]
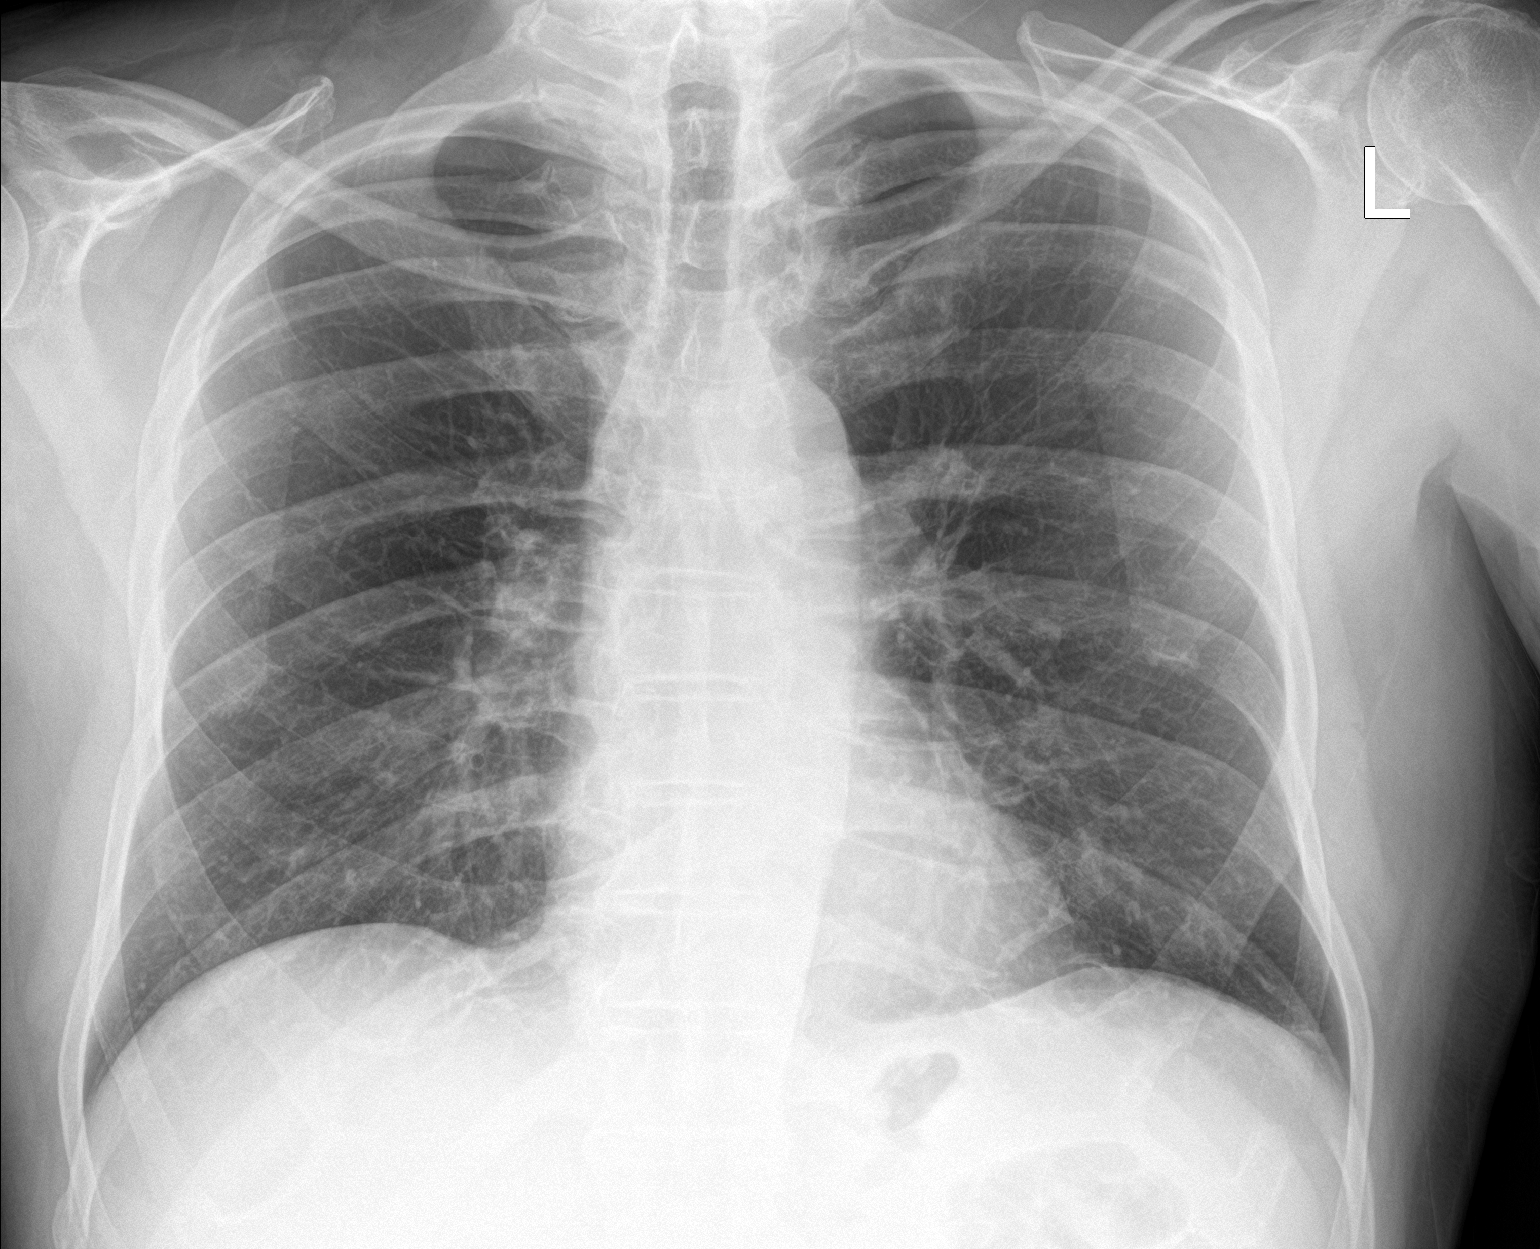

[chest lat]
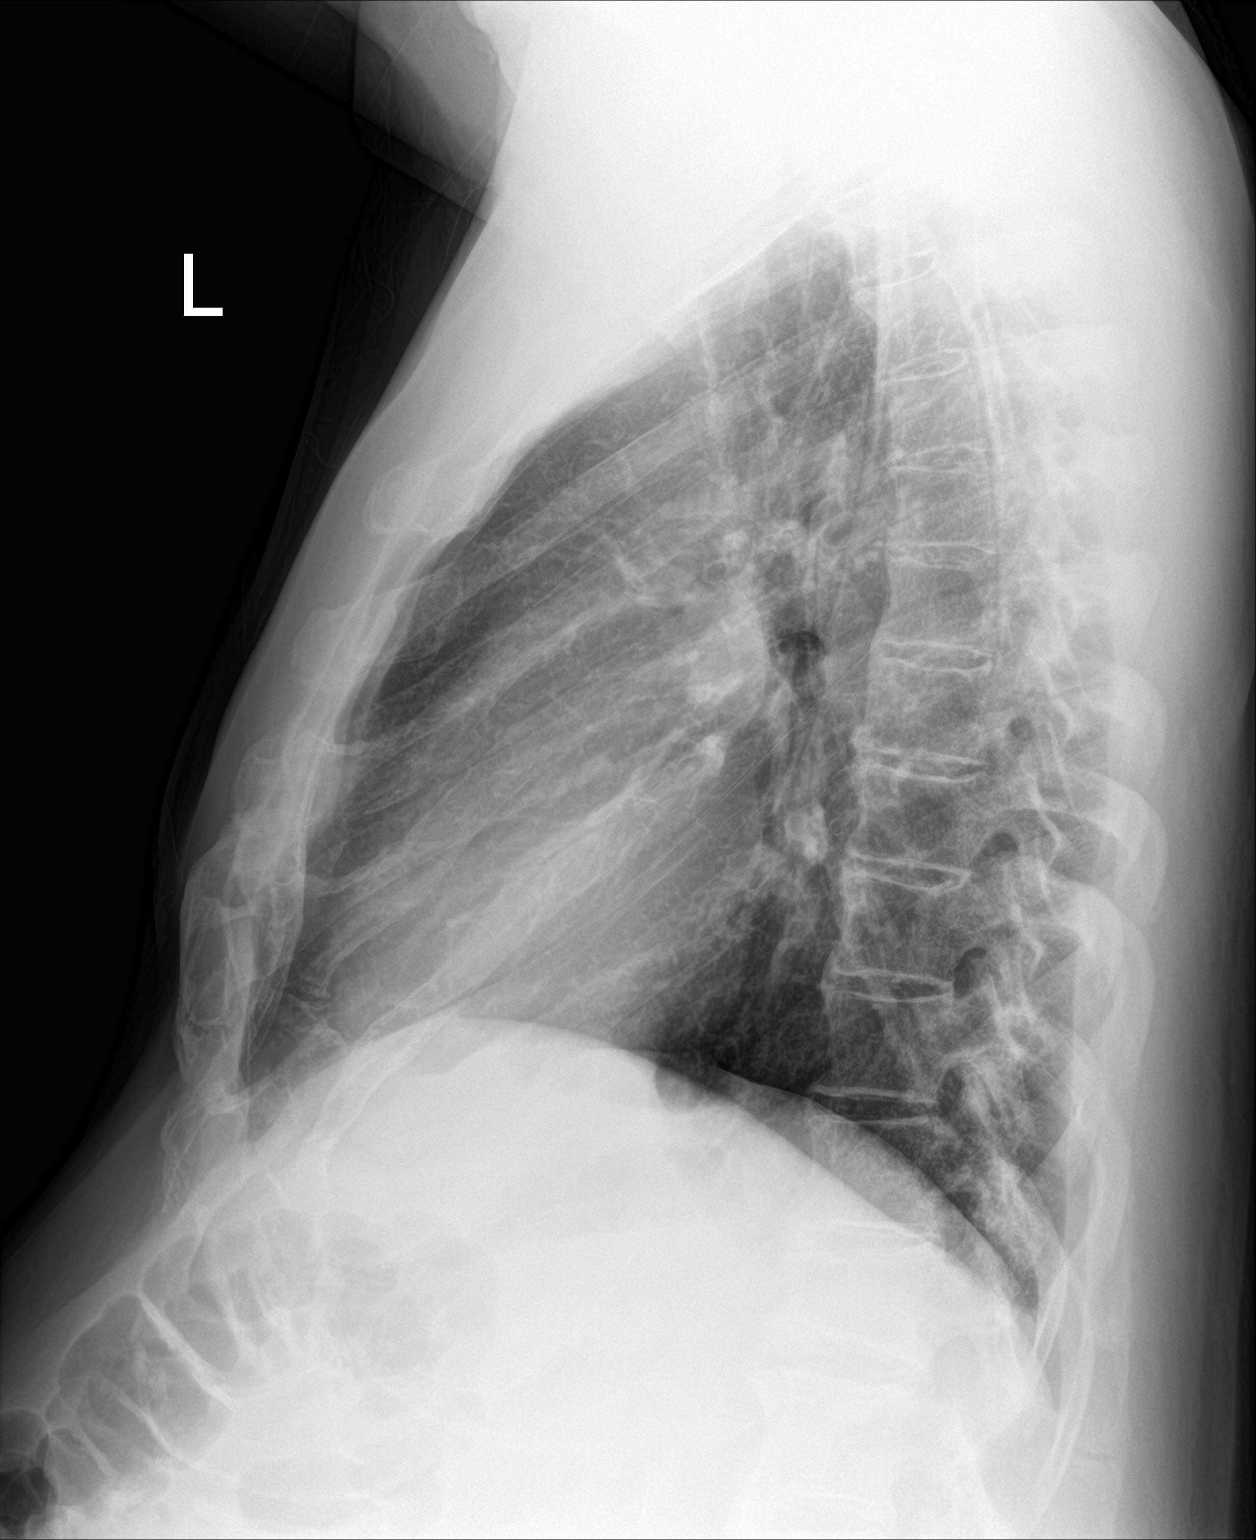

[2 of 2 positions shown; findings below may reference images not displayed]

FINDINGS: The heart size and mediastinal contours are within normal limits.
Both lungs are clear. The visualized skeletal structures are
unremarkable.
IMPRESSION: No active cardiopulmonary disease.

## 2022-05-26 ENCOUNTER — Ambulatory Visit: Payer: 59 | Admitting: Orthopaedic Surgery

## 2022-06-05 ENCOUNTER — Ambulatory Visit (INDEPENDENT_AMBULATORY_CARE_PROVIDER_SITE_OTHER): Payer: 59 | Admitting: Internal Medicine

## 2022-06-05 ENCOUNTER — Encounter: Payer: Self-pay | Admitting: Internal Medicine

## 2022-06-05 VITALS — BP 132/70 | HR 88 | Resp 16 | Ht 71.0 in | Wt 204.0 lb

## 2022-06-05 DIAGNOSIS — E118 Type 2 diabetes mellitus with unspecified complications: Secondary | ICD-10-CM | POA: Diagnosis not present

## 2022-06-05 DIAGNOSIS — M5412 Radiculopathy, cervical region: Secondary | ICD-10-CM

## 2022-06-05 DIAGNOSIS — M25511 Pain in right shoulder: Secondary | ICD-10-CM

## 2022-06-05 DIAGNOSIS — M25512 Pain in left shoulder: Secondary | ICD-10-CM

## 2022-06-05 DIAGNOSIS — G8929 Other chronic pain: Secondary | ICD-10-CM

## 2022-06-05 DIAGNOSIS — I10 Essential (primary) hypertension: Secondary | ICD-10-CM

## 2022-06-05 DIAGNOSIS — Z79899 Other long term (current) drug therapy: Secondary | ICD-10-CM | POA: Diagnosis not present

## 2022-06-05 MED ORDER — IBUPROFEN 600 MG PO TABS: 600.0000 mg | ORAL_TABLET | Freq: Three times a day (TID) | ORAL | 2 refills | Status: DC | PRN

## 2022-06-05 NOTE — Progress Notes (Signed)
Subjective:    Patient ID: Spencer Skeens., male   DOB: October 08, 1955, 67 y.o.   MRN: [id number]   HPI   Hyperlipidemia:  At goal in September.   2.  Hypertension:  fair control.     3.  Peripheral neuropathy:  does not like the way he feels on Gabapentin--only takes here and there at bedtime.  Discussed does not work well that way.    4.  Chronic bilateral shoulder pain and back pain:  keeping him up at night.  Does not want to take the Trazodone--stopped taking.  States pain is keeping him up.  Seeing ortho next week--states his wife set it up and he does not know name of physician.   Has increasing pain in bilateral shoulders from neck and radiates down lateral sides of arms to ulnar hand and fingers on ulnar aspect have numbness and tingling.  If he gets up and walks around, feels better.      5.  Toenail onychomycosis:  Treated in 2021 with improvement with Terbinafine.  Unable to bend over to care for feet and nails and followed by Dr. Stacie Acres, podiatry.  No interest in retreatment with oral medication at this time and cannot paint nails with topical treatment.  Current Meds  Medication Sig   aspirin EC 81 MG tablet Take 1 tablet (81 mg total) by mouth daily. Swallow whole.   atorvastatin (LIPITOR) 80 MG tablet TAKE 1 TABLET BY MOUTH ONCE DAILY WITH EVENING MEAL   baclofen (LIORESAL) 10 MG tablet Take 0.5 tablets (5 mg total) by mouth 2 (two) times daily.   diclofenac Sodium (VOLTAREN) 1 % GEL Apply 2 to 4 g to affected areas up to twice daily.   EPINEPHrine (EPIPEN 2-PAK) 0.3 mg/0.3 mL IJ SOAJ injection Inject 0.3 mg into the muscle as needed for anaphylaxis.   gabapentin (NEURONTIN) 100 MG capsule 1 cap by mouth at bedtime daily to increase to 2 caps in 3 days and increase to 3 caps in 3 days and remain on that dose (Patient taking differently: 1 cap by mouth at bedtime daily to increase to 2 caps in 3 days and increase to 3 caps in 3 days and remain on that dose. As  needed  Has been taking just 1 cap as needed)   hydrochlorothiazide (HYDRODIURIL) 25 MG tablet TAKE 1 TABLET BY MOUTH ONCE DAILY IN THE MORNING WITH  LOSARTAN   losartan (COZAAR) 50 MG tablet Take 2 tablets by mouth once daily   metFORMIN (GLUCOPHAGE) 500 MG tablet TAKE 1 TABLET BY MOUTH TWICE DAILY WITH A MEAL   Allergies  Allergen Reactions   Cherry Extract Anaphylaxis, Hives, Swelling and Rash   Fruit & Vegetable Daily [Nutritional Supplements] Anaphylaxis, Hives, Swelling and Rash    Fruit cocktail: Lips swell   Other Anaphylaxis and Swelling    NO NUTS!!!!   Peach [Prunus Persica] Anaphylaxis and Swelling   Pecan Nut (Diagnostic) Shortness Of Breath and Swelling    Throat and tongue swelling   Penicillins Anaphylaxis and Swelling    Has patient had a PCN reaction causing immediate rash, facial/tongue/throat swelling, SOB or lightheadedness with hypotension: Yes Has patient had a PCN reaction causing severe rash involving mucus membranes or skin necrosis: No Has patient had a PCN reaction that required hospitalization: No Has patient had a PCN reaction occurring within the last 10 years: No If all of the above answers are "NO", then may proceed with Cephalosporin use.    Pineapple  Anaphylaxis, Hives and Swelling   Lisinopril Cough     Review of Systems    Objective:   BP 132/70 (BP Location: Right Arm, Patient Position: Sitting, Cuff Size: Normal)   Pulse 88   Resp 16   Ht 5\' 11"  (1.803 m)   Wt 204 lb (92.5 kg)   BMI 28.45 kg/m   Physical Exam NAD HEENT:  PERRL, EOMI,  Neck:  Supple, No adenopathy Chest:  CTA CV:  RRR without murmur or rub.  Carotid, radial and DP pulses normal and equal Abd:  protuberant.  NT, No HSM or mass, + BS LE:  No edema MS:  tender over cervical paraspinous musculature.  NT over spinous processes.  Decreased ROM.   Neuro:  A & O x 3, CN II-XII grossly intact.  Motor 5/5, DTRs 2+/4 throughout.  Sensation to light touch normal    Assessment & Plan    DM:  A1C  2.  Hypertension:  better controlled.  CPM.  CBC, CMP  3.  Hyperlipidemia:  controlled.  4.  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  Followed by Dr. Ophelia Charter.

## 2022-06-06 LAB — CBC WITH DIFFERENTIAL/PLATELET
Basophils Absolute: 0.1 10*3/uL (ref 0.0–0.2)
Basos: 1 %
EOS (ABSOLUTE): 0.2 10*3/uL (ref 0.0–0.4)
Eos: 4 %
Hematocrit: 40.5 % (ref 37.5–51.0)
Hemoglobin: 13.5 g/dL (ref 13.0–17.7)
Immature Grans (Abs): 0 10*3/uL (ref 0.0–0.1)
Immature Granulocytes: 0 %
Lymphocytes Absolute: 1.4 10*3/uL (ref 0.7–3.1)
Lymphs: 28 %
MCH: 30.1 pg (ref 26.6–33.0)
MCHC: 33.3 g/dL (ref 31.5–35.7)
MCV: 90 fL (ref 79–97)
Monocytes Absolute: 0.5 10*3/uL (ref 0.1–0.9)
Monocytes: 11 %
Neutrophils Absolute: 2.8 10*3/uL (ref 1.4–7.0)
Neutrophils: 56 %
Platelets: 232 10*3/uL (ref 150–450)
RBC: 4.49 x10E6/uL (ref 4.14–5.80)
RDW: 12.3 % (ref 11.6–15.4)
WBC: 5 10*3/uL (ref 3.4–10.8)

## 2022-06-06 LAB — COMPREHENSIVE METABOLIC PANEL
ALT: 22 IU/L (ref 0–44)
AST: 22 IU/L (ref 0–40)
Albumin/Globulin Ratio: 2.1 (ref 1.2–2.2)
Albumin: 4.8 g/dL (ref 3.9–4.9)
Alkaline Phosphatase: 87 IU/L (ref 44–121)
BUN/Creatinine Ratio: 16 (ref 10–24)
BUN: 15 mg/dL (ref 8–27)
Bilirubin Total: 0.6 mg/dL (ref 0.0–1.2)
CO2: 21 mmol/L (ref 20–29)
Calcium: 9.2 mg/dL (ref 8.6–10.2)
Chloride: 100 mmol/L (ref 96–106)
Creatinine, Ser: 0.93 mg/dL (ref 0.76–1.27)
Globulin, Total: 2.3 g/dL (ref 1.5–4.5)
Glucose: 123 mg/dL — ABNORMAL HIGH (ref 70–99)
Potassium: 3.8 mmol/L (ref 3.5–5.2)
Sodium: 137 mmol/L (ref 134–144)
Total Protein: 7.1 g/dL (ref 6.0–8.5)
eGFR: 91 mL/min/{1.73_m2} (ref >59)

## 2022-06-06 LAB — HGB A1C W/O EAG: Hgb A1c MFr Bld: 6.7 % — ABNORMAL HIGH (ref 4.8–5.6)

## 2022-06-07 NOTE — Progress Notes (Unsigned)
Office Visit    Patient Name: Spencer Walton. Date of Encounter: 06/08/2022  Primary Care Provider:  [name], MD Primary Cardiologist:  Spencer Grooms, MD Primary Electrophysiologist: None  Chief Complaint    Spencer Walton. is a 67 y.o. male with PMH of CAD s/p LHC with 50% first diagonal stenosis, 25% LAD, DM type II, HLD, HTN prostate CA s/p prostatectomy 07/2020 who presents today for follow-up of coronary artery disease. Past Medical History    Past Medical History:  Diagnosis Date   ACE-inhibitor cough 2016   Anxiety    Arthritis    Cancer ([place])    prostate; Gleason score 7.  Confined to prostate.  Lymph nodes negative.   Diabetes mellitus without complication ([place]) 54/2706   A1C was 10.3%   Elevated PSA, less than 10 ng/ml 01/22/2020   Free PSA 0.53   Food allergy, peanut 07/21/2018   Also pecan   Hyperlipidemia    Hypertension 11/2014   Left inguinal hernia    Low back pain    History of back injury as a teenager in [place].  States he had a fractured vertebrae.  Had ORIF.   Microalbuminuria 11/2014   Past Surgical History:  Procedure Laterality Date   Banding of internal hemorrhoids  1996   Extensive hand surgery  1977   pinning of fracture 4th Metacarpal fracture   Foreign body removal right hand  2006   HERNIA REPAIR Right 1999   x 3   INGUINAL HERNIA REPAIR Left 10/18/2018   Procedure: LEFT INGUINAL HERNIA REPAIR WITH MESH;  Surgeon: Spencer Mesa, MD;  Location: [facility];  Service: General;  Laterality: Left;   LEFT HEART CATH AND CORONARY ANGIOGRAPHY N/A 12/09/2021   Procedure: LEFT HEART CATH AND CORONARY ANGIOGRAPHY;  Surgeon: Spencer Booze, MD;  Location: [facility];  Service: Cardiovascular;  Laterality: N/A;   LYMPHADENECTOMY Bilateral 07/05/2020   Procedure: LYMPHADENECTOMY, PELVIC;  Surgeon: Spencer Lima, MD;  [facility];  Service: Urology;  Laterality: Bilateral;   NOSE SURGERY  1998    ORIF vertebral fracture  1975   ROBOT ASSISTED LAPAROSCOPIC RADICAL PROSTATECTOMY N/A 07/05/2020   Procedure: XI ROBOTIC ASSISTED LAPAROSCOPIC RADICAL PROSTATECTOMY;  Surgeon: Spencer Lima, MD;  [facility];  Service: Urology;  Laterality: N/A;  ONLY NEEDS 240 MIN FOR ALL PROCEDURES   UMBILICAL HERNIA REPAIR N/A 10/18/2018   Procedure: UMBILICAL HERNIA REPAIR WITH MESH;  Surgeon: Spencer Mesa, MD;  Location: [facility];  Service: General;  Laterality: N/A;    Allergies  Allergies  Allergen Reactions   Cherry Extract Anaphylaxis, Hives, Swelling and Rash   Fruit & Vegetable Daily [Nutritional Supplements] Anaphylaxis, Hives, Swelling and Rash    Fruit cocktail: Lips swell   Other Anaphylaxis and Swelling    NO NUTS!!!!   Peach [Prunus Persica] Anaphylaxis and Swelling   Pecan Nut (Diagnostic) Shortness Of Breath and Swelling    Throat and tongue swelling   Penicillins Anaphylaxis and Swelling    Has patient had a PCN reaction causing immediate rash, facial/tongue/throat swelling, SOB or lightheadedness with hypotension: Yes Has patient had a PCN reaction causing severe rash involving mucus membranes or skin necrosis: No Has patient had a PCN reaction that required hospitalization: No Has patient had a PCN reaction occurring within the last 10 years: No If all of the above answers are "NO", then may proceed with Cephalosporin use.    Pineapple Anaphylaxis, Hives and  Swelling   Lisinopril Cough    History of Present Illness    Spencer Walton.  is a 67 year old male with the above mention past medical history who presents today for overdue follow-up of coronary artery disease.  Spencer Walton was seen by Dr. Irish Lack by referral on 11/10/2021 for complaint of precordial chest pain.  He reported pain worse with stress and not related to strenuous activity.  He had no previous cardiac history or testing noted.  Cardiac CTA was ordered for further evaluation that  revealed calcium score of 1412 with significant multivessel disease.  Left heart catheterization was scheduled and patient completed on 12/09/2021 with results showing nonobstructive disease in the mid LAD and 50% first diagonal small vessel, EF 55-65% with no valvular disease noted.  Medical therapy was recommended and patient was started on ASA 81 mg and Lipitor 80 mg.  Spencer Walton presents today with his wife for overdue follow-up.  Since last being seen in the office patient reports that he has experienced occasional bouts of chest pain that are related with emotional stress.  He has recently taken in his 3 grandchildren which consist of 62-year-old twins and one 4-year-old boy.  They were brought today by child protective services and this has become a major role strain on him and his wife.  He denies any changes to his current medication regimen and is tolerating without any adverse reactions.  His blood pressure today is 124/72 and heart rate was 87.  He is euvolemic on examination and is currently following a heart healthy diet.  He reports no occurrences of palpitations, dyspnea, PND, orthopnea, nausea, vomiting, dizziness, syncope, edema, weight gain, or early satiety.   Home Medications    Current Outpatient Medications  Medication Sig Dispense Refill   amLODipine (NORVASC) 5 MG tablet Take 1 tablet (5 mg total) by mouth daily. 30 tablet 3   aspirin EC 81 MG tablet Take 1 tablet (81 mg total) by mouth daily. Swallow whole. 90 tablet 3   atorvastatin (LIPITOR) 80 MG tablet TAKE 1 TABLET BY MOUTH ONCE DAILY WITH EVENING MEAL 90 tablet 11   baclofen (LIORESAL) 10 MG tablet Take 0.5 tablets (5 mg total) by mouth 2 (two) times daily. 30 each 0   diclofenac Sodium (VOLTAREN) 1 % GEL Apply 2 to 4 g to affected areas up to twice daily. 350 g 6   EPINEPHrine (EPIPEN 2-PAK) 0.3 mg/0.3 mL IJ SOAJ injection Inject 0.3 mg into the muscle as needed for anaphylaxis. 1 each 1   gabapentin (NEURONTIN) 100 MG  capsule 1 cap by mouth at bedtime daily to increase to 2 caps in 3 days and increase to 3 caps in 3 days and remain on that dose 90 capsule 11   hydrochlorothiazide (HYDRODIURIL) 25 MG tablet TAKE 1 TABLET BY MOUTH ONCE DAILY IN THE MORNING WITH  LOSARTAN 30 tablet 10   ibuprofen (ADVIL) 600 MG tablet Take 1 tablet (600 mg total) by mouth every 8 (eight) hours as needed. 30 tablet 2   losartan (COZAAR) 50 MG tablet Take 2 tablets by mouth once daily 60 tablet 10   metFORMIN (GLUCOPHAGE) 500 MG tablet TAKE 1 TABLET BY MOUTH TWICE DAILY WITH A MEAL 60 tablet 10   nitroGLYCERIN (NITROSTAT) 0.4 MG SL tablet Place 1 tablet (0.4 mg total) under the tongue every 5 (five) minutes as needed for chest pain. 25 tablet 3   No current facility-administered medications for this visit.  Review of Systems  Please see the history of present illness.    (+) Chest pain (+) Stress and anxiety  All other systems reviewed and are otherwise negative except as noted above.  Physical Exam    Wt Readings from Last 3 Encounters:  06/08/22 210 lb (95.3 kg)  06/05/22 204 lb (92.5 kg)  01/28/22 204 lb (92.5 kg)   VS: Vitals:   06/08/22 0926 06/08/22 1014  BP: (!) 158/80 124/72  Pulse: (!) 101   SpO2: 98%   ,Body mass index is 29.29 kg/m.  Constitutional:      Appearance: Healthy appearance. Not in distress.  Neck:     Vascular: JVD normal.  Pulmonary:     Effort: Pulmonary effort is normal.     Breath sounds: No wheezing. No rales. Diminished in the bases Cardiovascular:     Normal rate. Regular rhythm. Normal S1. Normal S2.      Murmurs: There is no murmur.  Edema:    Peripheral edema absent.  Abdominal:     Palpations: Abdomen is soft non tender. There is no hepatomegaly.  Skin:    General: Skin is warm and dry.  Neurological:     General: No focal deficit present.     Mental Status: Alert and oriented to person, place and time.     Cranial Nerves: Cranial nerves are intact.   EKG/LABS/Other Studies Reviewed    ECG personally reviewed by me today -none completed today   Lab Results  Component Value Date   WBC 5.0 06/05/2022   HGB 13.5 06/05/2022   HCT 40.5 06/05/2022   MCV 90 06/05/2022   PLT 232 06/05/2022   Lab Results  Component Value Date   CREATININE 0.93 06/05/2022   BUN 15 06/05/2022   NA 137 06/05/2022   K 3.8 06/05/2022   CL 100 06/05/2022   CO2 21 06/05/2022   Lab Results  Component Value Date   ALT 22 06/05/2022   AST 22 06/05/2022   ALKPHOS 87 06/05/2022   BILITOT 0.6 06/05/2022   Lab Results  Component Value Date   CHOL 134 01/26/2022   HDL 47 01/26/2022   LDLCALC 68 01/26/2022   TRIG 106 01/26/2022    Lab Results  Component Value Date   HGBA1C 6.7 (H) 06/05/2022    Assessment & Plan    1.  Nonobstructive CAD: -s/p cardiac CTA with calcium score of 1412 and subsequent LHC performed that revealed nonobstructive disease in LAD with 50% first diagonal. -Today patient reports she has experienced occasional bouts of chest pains that are associated with his new family dynamic of raising his 3 grandchildren who are under the age of 66. -He denies any dizziness or palpitations with his chest discomfort. -We will add amlodipine 5 mg daily for antianginal effect and blood pressure control. -Continue ASA 81 mg, atorvastatin 80 mg  2.  Aortic atherosclerosis: -Noted on cardiac CTA and patient currently on GDMT with ASA 81 mg and atorvastatin 80 mg as noted above  3.  Essential hypertension: -Patient's blood pressure today was 124/72 -Continue HCTZ 25 mg, amlodipine 5 mg as noted above and losartan 100 mg daily  4.  Hyperlipidemia: -Patient's last LDL cholesterol was 68 -Continue atorvastatin as noted above  5.  Diabetes mellitus type 2: -Patient's last hemoglobin A1c was 13.5 -Currently followed by PCP  Disposition: Follow-up with Spencer Grooms, MD or APP in 3 months    Medication Adjustments/Labs and Tests  Ordered: Current medicines are reviewed at  length with the patient today.  Concerns regarding medicines are outlined above.   Signed, Mable Fill, Marissa Nestle, NP 06/08/2022, 12:13 PM [facility]  Note:  This document was prepared using Dragon voice recognition software and may include unintentional dictation errors.

## 2022-06-08 ENCOUNTER — Encounter: Payer: Self-pay | Admitting: Nurse Practitioner

## 2022-06-08 ENCOUNTER — Ambulatory Visit: Payer: 59 | Attending: Nurse Practitioner | Admitting: Nurse Practitioner

## 2022-06-08 VITALS — BP 124/72 | HR 101 | Ht 71.0 in | Wt 210.0 lb

## 2022-06-08 DIAGNOSIS — E782 Mixed hyperlipidemia: Secondary | ICD-10-CM | POA: Diagnosis not present

## 2022-06-08 DIAGNOSIS — I2510 Atherosclerotic heart disease of native coronary artery without angina pectoris: Secondary | ICD-10-CM

## 2022-06-08 DIAGNOSIS — I10 Essential (primary) hypertension: Secondary | ICD-10-CM | POA: Diagnosis not present

## 2022-06-08 DIAGNOSIS — I700 Atherosclerosis of aorta: Secondary | ICD-10-CM

## 2022-06-08 MED ORDER — AMLODIPINE BESYLATE 5 MG PO TABS: 5.0000 mg | ORAL_TABLET | Freq: Every day | ORAL | 3 refills | Status: DC

## 2022-06-08 NOTE — Patient Instructions (Addendum)
Medication Instructions:  START Norvasc 5 mg  Take 1 tablet once a day  *If you need a refill on your cardiac medications before your next appointment, please call your pharmacy*   Lab Work: None Ordered  Testing/Procedures: None ordered  Follow-Up: At [facility], you and your health needs are our priority.  As part of our continuing mission to provide you with exceptional heart care, we have created designated Provider Care Teams.  These Care Teams include your primary Cardiologist (physician) and Advanced Practice Providers (APPs -  Physician Assistants and Nurse Practitioners) who all work together to provide you with the care you need, when you need it.  We recommend signing up for the patient portal called "MyChart".  Sign up information is provided on this After Visit Summary.  MyChart is used to connect with patients for Virtual Visits (Telemedicine).  Patients are able to view lab/test results, encounter notes, upcoming appointments, etc.  Non-urgent messages can be sent to your provider as well.   To learn more about what you can do with MyChart, go to [email or web address].    Your next appointment:   3 month(s)  Provider:   Ambrose Pancoast, NP     Then, Larae Grooms, MD will plan to see you again in 3 month(s).    Other Instructions

## 2022-06-10 ENCOUNTER — Ambulatory Visit (INDEPENDENT_AMBULATORY_CARE_PROVIDER_SITE_OTHER): Payer: 59

## 2022-06-10 ENCOUNTER — Ambulatory Visit (INDEPENDENT_AMBULATORY_CARE_PROVIDER_SITE_OTHER): Payer: 59 | Admitting: Orthopaedic Surgery

## 2022-06-10 ENCOUNTER — Encounter: Payer: Self-pay | Admitting: Orthopaedic Surgery

## 2022-06-10 VITALS — BP 148/90 | HR 92 | Ht 72.0 in | Wt 210.0 lb

## 2022-06-10 DIAGNOSIS — M542 Cervicalgia: Secondary | ICD-10-CM | POA: Diagnosis not present

## 2022-06-10 DIAGNOSIS — G8929 Other chronic pain: Secondary | ICD-10-CM

## 2022-06-10 DIAGNOSIS — M545 Low back pain, unspecified: Secondary | ICD-10-CM

## 2022-06-10 DIAGNOSIS — M47816 Spondylosis without myelopathy or radiculopathy, lumbar region: Secondary | ICD-10-CM | POA: Diagnosis not present

## 2022-06-10 NOTE — Progress Notes (Signed)
Office Visit Note   Patient: Spencer Walton.           Date of Birth: [birth year]           MRN: [id number] Visit Date: 06/10/2022              Requested by: [name], MD [place],  [place] [address] PCP: [name], MD   Assessment & Plan: Visit Diagnoses:  1. Neck pain   2. Chronic left-sided low back pain, unspecified whether sciatica present   3. Spondylosis without myelopathy or radiculopathy, lumbar region     Plan: Patient can still walk 4 blocks no myelopathic symptoms.  Patient can use ibuprofen or Tylenol as needed as well as a heating pad intermittently when he has some increased symptoms.  Follow-up if myelopathy or radiculopathy symptoms develop.  Follow-Up Instructions: No follow-ups on file.   Orders:  Orders Placed This Encounter  Procedures   XR Cervical Spine 2 or 3 views   XR Lumbar Spine 2-3 Views   No orders of the defined types were placed in this encounter.     Procedures: No procedures performed   Clinical Data: No additional findings.   Subjective: Chief Complaint  Patient presents with   Neck - Pain   Lower Back - Pain    HPI 67 year old patient last seen 2021 with neck and low back pain.  Numbness in his arms worse on the left than right.  Ibuprofen 600 mg without relief.  Low back pain radiates more in the left leg.  Patient is tried Naprosyn without relief.  History of a L1 compression fracture is teenager 30%.  History of carpal tunnel syndrome.  Possible diabetic peripheral neuropathy.  Last A1c 6.7.  Review of Systems all systems noncontributory to HPI.   Objective: Vital Signs: BP (!) 148/90   Pulse 92   Ht 6' (1.829 m)   Wt 210 lb (95.3 kg)   BMI 28.48 kg/m   Physical Exam Constitutional:      Appearance: He is well-developed.  HENT:     Head: Normocephalic and atraumatic.     Right Ear: External ear normal.     Left Ear: External ear normal.  Eyes:     Pupils: Pupils are equal,  round, and reactive to light.  Neck:     Thyroid: No thyromegaly.     Trachea: No tracheal deviation.  Cardiovascular:     Rate and Rhythm: Normal rate.  Pulmonary:     Effort: Pulmonary effort is normal.     Breath sounds: No wheezing.  Abdominal:     General: Bowel sounds are normal.     Palpations: Abdomen is soft.  Musculoskeletal:     Cervical back: Neck supple.  Skin:    General: Skin is warm and dry.     Capillary Refill: Capillary refill takes less than 2 seconds.  Neurological:     Mental Status: He is alert and oriented to person, place, and time.  Psychiatric:        Behavior: Behavior normal.        Thought Content: Thought content normal.        Judgment: Judgment normal.     Ortho Exam upper lower extremity reflexes are 2+ and symmetrical.  Specialty Comments:  No specialty comments available.  Imaging: XR Lumbar Spine 2-3 Views  Result Date: 06/10/2022 AP lateral lumbar spine images are obtained and reviewed.  He had noted L1 30% anterior compression  fracture unchanged.  No listhesis.  Some anterior spurring noted at L2-3 L3-4. Impression: Lumbar disc degeneration with endplate spurring.  No acute changes.  XR Cervical Spine 2 or 3 views  Result Date: 06/10/2022 AP lateral cervical spine images are obtained and reviewed this shows straightening of the cervical spine.  Endplate spurring E0-8 X4-4 C6-7 with disc space narrowing no listhesis.  Facet arthropathy noted slightly worse in the right than left mid cervical. Impression: Cervical spondylosis with facet arthropathy.    PMFS History: Patient Active Problem List   Diagnosis Date Noted   Other insomnia 02/18/2022   Anxiety 02/18/2022   Right carpal tunnel syndrome 02/18/2022   Chronic pain of both shoulders 02/18/2022   Abnormal CT scan, heart    Chronic bilateral thoracic back pain 12/01/2021   Cancer ([place])    Hyperlipidemia    Prostate cancer ([place]) 07/05/2020   Elevated PSA, less than 10 ng/ml  01/22/2020   Spondylosis without myelopathy or radiculopathy, lumbar region 12/27/2019   Mixed hyperlipidemia 10/27/2019   Diabetes mellitus type 2 with complications ([place]) 81/85/6314   Tinea pedis of both feet 09/13/2019   Onychomycosis 09/13/2019   Diabetic peripheral neuropathy ([place]) 07/04/2019   Pain due to onychomycosis of toenails of both feet 03/29/2019   Food allergy, peanut 07/21/2018   Low back pain    Diabetes mellitus without complication ([place]) 97/06/6376   Primary hypertension 11/02/2014   Microalbuminuria 11/02/2014   Hypertension 11/2014   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/04/2014   Past Medical History:  Diagnosis Date   ACE-inhibitor cough 2016   Anxiety    Arthritis    Cancer ([place])    prostate; Gleason score 7.  Confined to prostate.  Lymph nodes negative.   Diabetes mellitus without complication ([place]) 58/8502   A1C was 10.3%   Elevated PSA, less than 10 ng/ml 01/22/2020   Free PSA 0.53   Food allergy, peanut 07/21/2018   Also pecan   Hyperlipidemia    Hypertension 11/2014   Left inguinal hernia    Low back pain    History of back injury as a teenager in [place].  States he had a fractured vertebrae.  Had ORIF.   Microalbuminuria 11/2014    Family History  Problem Relation Age of Onset   Cancer Mother        colon cancer   Hypertension Mother    Colon cancer Mother 58   Heart disease Father        MI was cause of death during a seizure   Seizures Father    Cancer Sister        Lung--smoker   Seizures Sister    Colon polyps Sister    Cancer Brother        prostate   Multiple sclerosis Brother    Heart disease Brother        history of MI   Heart disease Brother 69       MI   Gallstones Daughter        not clear this is actually her diagnosis   Esophageal cancer Neg Hx    Stomach cancer Neg Hx    Rectal cancer Neg Hx     Past Surgical History:  Procedure Laterality Date   Banding of internal hemorrhoids  1996   Extensive hand surgery  1977   pinning of  fracture 4th Metacarpal fracture   Foreign body removal right hand  2006   [place] Right 1999   x 3  INGUINAL HERNIA REPAIR Left 10/18/2018   Procedure: LEFT INGUINAL HERNIA REPAIR WITH MESH;  Surgeon: Donnie Mesa, MD;  Location: [facility];  Service: General;  Laterality: Left;   LEFT HEART CATH AND CORONARY ANGIOGRAPHY N/A 12/09/2021   Procedure: LEFT HEART CATH AND CORONARY ANGIOGRAPHY;  Surgeon: Jettie Booze, MD;  Location: [facility];  Service: Cardiovascular;  Laterality: N/A;   LYMPHADENECTOMY Bilateral 07/05/2020   Procedure: LYMPHADENECTOMY, PELVIC;  Surgeon: Janith Lima, MD;  [facility];  Service: Urology;  Laterality: Bilateral;   NOSE SURGERY  1998   ORIF vertebral fracture  1975   ROBOT ASSISTED LAPAROSCOPIC RADICAL PROSTATECTOMY N/A 07/05/2020   Procedure: XI ROBOTIC ASSISTED LAPAROSCOPIC RADICAL PROSTATECTOMY;  Surgeon: Janith Lima, MD;  [facility];  Service: Urology;  Laterality: N/A;  ONLY NEEDS 240 MIN FOR ALL PROCEDURES   UMBILICAL HERNIA REPAIR N/A 10/18/2018   Procedure: UMBILICAL HERNIA REPAIR WITH MESH;  Surgeon: Donnie Mesa, MD;  Location: [facility];  Service: General;  Laterality: N/A;   Social History   Occupational History   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   Drug use: Never   Sexual activity: Not Currently

## 2022-07-01 ENCOUNTER — Encounter: Payer: Self-pay | Admitting: Podiatry

## 2022-07-01 ENCOUNTER — Ambulatory Visit (INDEPENDENT_AMBULATORY_CARE_PROVIDER_SITE_OTHER): Payer: 59 | Admitting: Podiatry

## 2022-07-01 DIAGNOSIS — E1142 Type 2 diabetes mellitus with diabetic polyneuropathy: Secondary | ICD-10-CM | POA: Diagnosis not present

## 2022-07-01 DIAGNOSIS — M79675 Pain in left toe(s): Secondary | ICD-10-CM | POA: Diagnosis not present

## 2022-07-01 DIAGNOSIS — B351 Tinea unguium: Secondary | ICD-10-CM

## 2022-07-01 DIAGNOSIS — M79674 Pain in right toe(s): Secondary | ICD-10-CM | POA: Diagnosis not present

## 2022-07-01 NOTE — Progress Notes (Signed)
This patient presents the office for treatment of his long painful toenails both feet.  Patient states that he has back problems and he is unable to self treat his nails.  Patient states the he is diabetic.Spencer Walton  He presents the office today for  nail care.  General Appearance  Alert, conversant and in no acute stress.  Vascular  Dorsalis pedis and posterior tibial  pulses are  weakly  palpable  bilaterally.  Capillary return is within normal limits  bilaterally. Temperature is within normal limits  bilaterally.  Neurologic  Senn-Weinstein monofilament wire test within normal limits  bilaterally. Muscle power within normal limits bilaterally.  Nails Thick disfigured discolored nails with subungual debris  from hallux to fifth toes bilaterally.  Orthopedic  No limitations of motion  feet .  No crepitus or effusions noted.  No bony pathology or digital deformities noted. Hammer toe 2-5  B/L.  Hallux malleus  B/L  Skin  normotropic skin with no porokeratosis noted bilaterally.  No signs of infections or ulcers noted.    Onychomycosis  X 10.  Diabetes  ROV.  Debride nails.  Discussed neuropathy with this patient RTC 3 months.    Gardiner Barefoot DPM

## 2022-08-22 ENCOUNTER — Ambulatory Visit (HOSPITAL_COMMUNITY)
Admission: RE | Admit: 2022-08-22 | Discharge: 2022-08-22 | Disposition: A | Discharge status: Home / Self Care | Payer: 59 | Source: Ambulatory Visit | Attending: Emergency Medicine | Admitting: Emergency Medicine

## 2022-08-22 ENCOUNTER — Ambulatory Visit (INDEPENDENT_AMBULATORY_CARE_PROVIDER_SITE_OTHER): Payer: 59

## 2022-08-22 ENCOUNTER — Encounter (HOSPITAL_COMMUNITY): Payer: Self-pay

## 2022-08-22 VITALS — BP 134/80 | HR 86 | Temp 98.1°F | Resp 18 | Wt 210.0 lb

## 2022-08-22 DIAGNOSIS — M19012 Primary osteoarthritis, left shoulder: Secondary | ICD-10-CM

## 2022-08-22 MED ORDER — KETOROLAC TROMETHAMINE 30 MG/ML IJ SOLN
INTRAMUSCULAR | Status: AC
  Filled 2022-08-22: qty 1

## 2022-08-22 MED ORDER — BACLOFEN 10 MG PO TABS: 10.0000 mg | ORAL_TABLET | Freq: Three times a day (TID) | ORAL | 0 refills | Status: DC

## 2022-08-22 MED ORDER — KETOROLAC TROMETHAMINE 30 MG/ML IJ SOLN
30.0000 mg | Freq: Once | INTRAMUSCULAR | Status: AC
  Administered 2022-08-22: 30 mg via INTRAMUSCULAR

## 2022-08-22 MED ORDER — IBUPROFEN 600 MG PO TABS: 600.0000 mg | ORAL_TABLET | Freq: Three times a day (TID) | ORAL | 0 refills | Status: DC | PRN

## 2022-08-22 NOTE — Discharge Instructions (Addendum)
Your xray does not show any acute changes. You have some mild degenerative changes which are chronic.   The injection given today should provide several hours of pain relief. Please do not take any NSAIDs such as ibuprofen/Advil, naproxen/Aleve for the next 12 hours. You can take Tylenol 650 mg every 6 hours  You can take the muscle relaxer (baclofen) three times daily as needed. If the medication makes you drowsy, take only at bed time.  Please call your orthopedic specialist first thing Monday morning to set up appointment for follow up.

## 2022-08-22 NOTE — ED Triage Notes (Signed)
Pt states that he fell on his left side. 1 month ago. Takes Ibu. Doesn't help.
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
## 2022-09-08 ENCOUNTER — Encounter: Payer: Self-pay | Admitting: Nurse Practitioner

## 2022-09-08 ENCOUNTER — Ambulatory Visit: Payer: 59 | Attending: Nurse Practitioner | Admitting: Nurse Practitioner

## 2022-09-08 ENCOUNTER — Ambulatory Visit (INDEPENDENT_AMBULATORY_CARE_PROVIDER_SITE_OTHER): Payer: 59

## 2022-09-08 VITALS — BP 116/80 | HR 82 | Ht 72.0 in | Wt 209.4 lb

## 2022-09-08 DIAGNOSIS — R55 Syncope and collapse: Secondary | ICD-10-CM | POA: Diagnosis not present

## 2022-09-08 DIAGNOSIS — I10 Essential (primary) hypertension: Secondary | ICD-10-CM | POA: Diagnosis not present

## 2022-09-08 DIAGNOSIS — E119 Type 2 diabetes mellitus without complications: Secondary | ICD-10-CM | POA: Diagnosis not present

## 2022-09-08 DIAGNOSIS — E782 Mixed hyperlipidemia: Secondary | ICD-10-CM

## 2022-09-08 DIAGNOSIS — Z7985 Long-term (current) use of injectable non-insulin antidiabetic drugs: Secondary | ICD-10-CM

## 2022-09-08 DIAGNOSIS — I2510 Atherosclerotic heart disease of native coronary artery without angina pectoris: Secondary | ICD-10-CM

## 2022-09-08 DIAGNOSIS — I700 Atherosclerosis of aorta: Secondary | ICD-10-CM

## 2022-09-08 MED ORDER — HYDROCHLOROTHIAZIDE 25 MG PO TABS: 25.0000 mg | ORAL_TABLET | Freq: Every day | ORAL | 2 refills | Status: DC

## 2022-09-08 NOTE — Patient Instructions (Signed)
Medication Instructions:  CONTINUE Taking Hydrochlorothiazide  *If you need a refill on your cardiac medications before your next appointment, please call your pharmacy*   Lab Work: None ordered   Testing/Procedures: Your physician has requested that you have an echocardiogram. Echocardiography is a painless test that uses sound waves to create images of your heart. It provides your doctor with information about the size and shape of your heart and how well your heart's chambers and valves are working. This procedure takes approximately one hour. There are no restrictions for this procedure. Please do NOT wear cologne, perfume, aftershave, or lotions (deodorant is allowed). Please arrive 15 minutes prior to your appointment time.  ZIO XT- Long Term Monitor Instructions  Your physician has requested you wear a ZIO patch monitor for 14 days.  This is a single patch monitor. [employer] supplies one patch monitor per enrollment. Additional stickers are not available. Please do not apply patch if you will be having a Nuclear Stress Test,  Echocardiogram, Cardiac CT, MRI, or Chest Xray during the period you would be wearing the  monitor. The patch cannot be worn during these tests. You cannot remove and re-apply the  ZIO XT patch monitor.  Your ZIO patch monitor will be mailed 3 day USPS to your address on file. It may take 3-5 days  to receive your monitor after you have been enrolled.  Once you have received your monitor, please review the enclosed instructions. Your monitor  has already been registered assigning a specific monitor serial # to you.  Billing and Patient Assistance Program Information  We have supplied [employer] with any of your insurance information on file for billing purposes. [employer] offers a sliding scale Patient Assistance Program for patients that do not have  insurance, or whose insurance does not completely cover the cost of the ZIO monitor.  You must apply for the  Patient Assistance Program to qualify for this discounted rate.  To apply, please call [employer] at [phone], select option 4, select option 2, ask to apply for  Patient Assistance Program. Meredeth Ide will ask your household income, and how many people  are in your household. They will quote your out-of-pocket cost based on that information.  [employer] will also be able to set up a 70-month, interest-free payment plan if needed.  Applying the monitor   Shave hair from upper left chest.  Hold abrader disc by orange tab. Rub abrader in 40 strokes over the upper left chest as  indicated in your monitor instructions.  Clean area with 4 enclosed alcohol pads. Let dry.  Apply patch as indicated in monitor instructions. Patch will be placed under collarbone on left  side of chest with arrow pointing upward.  Rub patch adhesive wings for 2 minutes. Remove white label marked "1". Remove the white  label marked "2". Rub patch adhesive wings for 2 additional minutes.  While looking in a mirror, press and release button in center of patch. A small green light will  flash 3-4 times. This will be your only indicator that the monitor has been turned on.  Do not shower for the first 24 hours. You may shower after the first 24 hours.  Press the button if you feel a symptom. You will hear a small click. Record Date, Time and  Symptom in the Patient Logbook.  When you are ready to remove the patch, follow instructions on the last 2 pages of Patient  Logbook. Stick patch monitor onto the last page of Patient  Logbook.  Place Patient Logbook in the blue and white box. Use locking tab on box and tape box closed  securely. The blue and white box has prepaid postage on it. Please place it in the mailbox as  soon as possible. Your physician should have your test results approximately 7 days after the  monitor has been mailed back to [facility].  Call [facility] Customer Care at [phone] if you have  questions regarding  your ZIO XT patch monitor. Call them immediately if you see an orange light blinking on your  monitor.  If your monitor falls off in less than 4 days, contact our Monitor department at [phone].  If your monitor becomes loose or falls off after 4 days call [employer] at [phone] for  suggestions on securing your monitor  Follow-Up: At [facility], you and your health needs are our priority.  As part of our continuing mission to provide you with exceptional heart care, we have created designated Provider Care Teams.  These Care Teams include your primary Cardiologist (physician) and Advanced Practice Providers (APPs -  Physician Assistants and Nurse Practitioners) who all work together to provide you with the care you need, when you need it.  We recommend signing up for the patient portal called "MyChart".  Sign up information is provided on this After Visit Summary.  MyChart is used to connect with patients for Virtual Visits (Telemedicine).  Patients are able to view lab/test results, encounter notes, upcoming appointments, etc.  Non-urgent messages can be sent to your provider as well.   To learn more about what you can do with MyChart, go to [email or web address].    Your next appointment:   6 month(s)  Provider:   Lance Muss, MD     Other Instructions

## 2022-09-08 NOTE — Progress Notes (Unsigned)
ZIO XT serial # [id number] from office inventory applied to patient.  Dr. Eldridge Dace to read.

## 2022-09-22 ENCOUNTER — Ambulatory Visit: Payer: 59 | Admitting: Orthopaedic Surgery

## 2022-10-01 ENCOUNTER — Other Ambulatory Visit: Payer: Self-pay

## 2022-10-01 MED ORDER — LOSARTAN POTASSIUM 50 MG PO TABS: 100.0000 mg | ORAL_TABLET | Freq: Every day | ORAL | 8 refills | Status: DC

## 2022-10-07 ENCOUNTER — Other Ambulatory Visit: Payer: Self-pay

## 2022-10-07 MED ORDER — IBUPROFEN 600 MG PO TABS: 600.0000 mg | ORAL_TABLET | Freq: Three times a day (TID) | ORAL | 0 refills | Status: DC | PRN

## 2022-10-08 ENCOUNTER — Ambulatory Visit (HOSPITAL_COMMUNITY): Payer: 59 | Attending: Nurse Practitioner

## 2022-10-08 DIAGNOSIS — R0609 Other forms of dyspnea: Secondary | ICD-10-CM

## 2022-10-08 DIAGNOSIS — I10 Essential (primary) hypertension: Secondary | ICD-10-CM | POA: Diagnosis present

## 2022-10-08 DIAGNOSIS — R55 Syncope and collapse: Secondary | ICD-10-CM | POA: Diagnosis present

## 2022-10-08 DIAGNOSIS — I2510 Atherosclerotic heart disease of native coronary artery without angina pectoris: Secondary | ICD-10-CM | POA: Insufficient documentation

## 2022-10-08 DIAGNOSIS — I700 Atherosclerosis of aorta: Secondary | ICD-10-CM | POA: Diagnosis present

## 2022-10-08 DIAGNOSIS — E782 Mixed hyperlipidemia: Secondary | ICD-10-CM | POA: Insufficient documentation

## 2022-10-08 DIAGNOSIS — E119 Type 2 diabetes mellitus without complications: Secondary | ICD-10-CM | POA: Insufficient documentation

## 2022-10-08 LAB — ECHOCARDIOGRAM COMPLETE
Area-P 1/2: 4.06 cm2
S' Lateral: 2.3 cm

## 2022-10-08 MED ORDER — PERFLUTREN LIPID MICROSPHERE
1.0000 mL | INTRAVENOUS | Status: AC | PRN
  Administered 2022-10-08: 2 mL via INTRAVENOUS

## 2022-10-09 ENCOUNTER — Telehealth: Payer: Self-pay

## 2022-10-09 NOTE — Telephone Encounter (Addendum)
Tried calling patient on the number listed and could not leave a voice message due to voice mailbox not set up. Will try calling patient again.  ----- Message from Gaston Islam., NP sent at 10/09/2022  6:58 AM EDT ----- Please let patient know his echo results show normal heart pumping function with mild stiffness of the heart muscle which occurs naturally with aging.  There is mild calcification around the mitral valve which is located on the left side of the heart with mild leaking called regurgitation.  Plan: -Please let patient know that treatment for managing mitral regurgitation is reducing blood pressure and maintaining healthy lifestyle with regular exercise -We will repeat echocardiogram in 2 years. -Please let patient know that his shortness of breath is likely related to deconditioning and not a cardiac or structural heart cause.   Robin Searing, NP

## 2022-10-12 ENCOUNTER — Telehealth: Payer: Self-pay | Admitting: *Deleted

## 2022-10-12 NOTE — Telephone Encounter (Signed)
Patient's wife notified of results and recommendations from Robin Searing, NP

## 2022-10-12 NOTE — Telephone Encounter (Signed)
I spoke with patient's wife and reviewed monitor results with her

## 2022-10-14 ENCOUNTER — Ambulatory Visit: Payer: 59 | Admitting: Podiatry

## 2022-11-14 ENCOUNTER — Other Ambulatory Visit: Payer: Self-pay | Admitting: Nurse Practitioner

## 2022-11-20 ENCOUNTER — Other Ambulatory Visit: Payer: Self-pay | Admitting: Internal Medicine

## 2022-11-23 ENCOUNTER — Telehealth: Payer: Self-pay

## 2022-11-23 NOTE — Telephone Encounter (Signed)
Patient would like to be seen sooner than his rescheduled appointment on 03/09/2023  Will call patient if there is any CPE cancellations.

## 2022-12-08 NOTE — Telephone Encounter (Signed)
Offered patient an appointment for 12/09/22 patient did not take appt.

## 2022-12-10 ENCOUNTER — Encounter: Payer: 59 | Admitting: Internal Medicine

## 2022-12-16 ENCOUNTER — Other Ambulatory Visit: Payer: Self-pay | Admitting: Nurse Practitioner

## 2022-12-18 ENCOUNTER — Ambulatory Visit: Payer: 59 | Admitting: Interventional Cardiology

## 2023-01-05 NOTE — Telephone Encounter (Signed)
Called patient to offer appointment, patient did not answer.

## 2023-01-06 NOTE — Telephone Encounter (Signed)
Called to offer appointment patient did not take, patient stated he is available Mondays.

## 2023-01-19 ENCOUNTER — Encounter: Payer: Self-pay | Admitting: Podiatry

## 2023-01-19 ENCOUNTER — Ambulatory Visit: Payer: 59 | Admitting: Podiatry

## 2023-01-19 ENCOUNTER — Ambulatory Visit (INDEPENDENT_AMBULATORY_CARE_PROVIDER_SITE_OTHER): Payer: 59 | Admitting: Podiatry

## 2023-01-19 DIAGNOSIS — B351 Tinea unguium: Secondary | ICD-10-CM | POA: Diagnosis not present

## 2023-01-19 DIAGNOSIS — E1142 Type 2 diabetes mellitus with diabetic polyneuropathy: Secondary | ICD-10-CM

## 2023-01-19 DIAGNOSIS — M79675 Pain in left toe(s): Secondary | ICD-10-CM

## 2023-01-19 DIAGNOSIS — M79674 Pain in right toe(s): Secondary | ICD-10-CM

## 2023-01-19 NOTE — Progress Notes (Signed)
This patient presents the office for treatment of his long painful toenails both feet.  Patient states that he has back problems and he is unable to self treat his nails.  Patient states the he is diabetic.Marland Kitchen  He presents the office today for  nail care.  General Appearance  Alert, conversant and in no acute stress.  Vascular  Dorsalis pedis and posterior tibial  pulses are  weakly  palpable  bilaterally.  Capillary return is within normal limits  bilaterally. Temperature is within normal limits  bilaterally.  Neurologic  Senn-Weinstein monofilament wire test within normal limits  bilaterally. Muscle power within normal limits bilaterally.  Nails Thick disfigured discolored nails with subungual debris  from hallux to fifth toes bilaterally.  Orthopedic  No limitations of motion  feet .  No crepitus or effusions noted.  No bony pathology or digital deformities noted. Hammer toe 2-5  B/L.  Hallux malleus  B/L  Skin  normotropic skin with no porokeratosis noted bilaterally.  No signs of infections or ulcers noted.    Onychomycosis  X 10.  Diabetes  ROV.  Debride nails.  Discussed neuropathy with this patient RTC 3 months.    Helane Gunther DPM

## 2023-02-13 ENCOUNTER — Other Ambulatory Visit: Payer: Self-pay | Admitting: Internal Medicine

## 2023-02-15 ENCOUNTER — Other Ambulatory Visit: Payer: Self-pay

## 2023-02-21 ENCOUNTER — Other Ambulatory Visit: Payer: Self-pay | Admitting: Internal Medicine

## 2023-03-08 ENCOUNTER — Ambulatory Visit: Payer: 59 | Admitting: Interventional Cardiology

## 2023-03-09 ENCOUNTER — Ambulatory Visit: Payer: 59 | Admitting: Internal Medicine

## 2023-03-09 ENCOUNTER — Encounter: Payer: Self-pay | Admitting: Internal Medicine

## 2023-03-09 VITALS — BP 146/86 | HR 100 | Resp 20 | Ht 72.0 in | Wt 216.0 lb

## 2023-03-09 DIAGNOSIS — E782 Mixed hyperlipidemia: Secondary | ICD-10-CM

## 2023-03-09 DIAGNOSIS — Z0000 Encounter for general adult medical examination without abnormal findings: Secondary | ICD-10-CM

## 2023-03-09 DIAGNOSIS — Z860101 Personal history of adenomatous and serrated colon polyps: Secondary | ICD-10-CM

## 2023-03-09 DIAGNOSIS — M2550 Pain in unspecified joint: Secondary | ICD-10-CM

## 2023-03-09 DIAGNOSIS — Z79899 Other long term (current) drug therapy: Secondary | ICD-10-CM

## 2023-03-09 DIAGNOSIS — I10 Essential (primary) hypertension: Secondary | ICD-10-CM | POA: Diagnosis not present

## 2023-03-09 DIAGNOSIS — E118 Type 2 diabetes mellitus with unspecified complications: Secondary | ICD-10-CM | POA: Diagnosis not present

## 2023-03-09 DIAGNOSIS — N39498 Other specified urinary incontinence: Secondary | ICD-10-CM

## 2023-03-09 DIAGNOSIS — M5412 Radiculopathy, cervical region: Secondary | ICD-10-CM | POA: Insufficient documentation

## 2023-03-09 DIAGNOSIS — R32 Unspecified urinary incontinence: Secondary | ICD-10-CM | POA: Insufficient documentation

## 2023-03-09 DIAGNOSIS — Z1211 Encounter for screening for malignant neoplasm of colon: Secondary | ICD-10-CM

## 2023-03-09 MED ORDER — IBUPROFEN 600 MG PO TABS
600.0000 mg | ORAL_TABLET | Freq: Three times a day (TID) | ORAL | 2 refills | Status: DC | PRN
Start: 2023-03-09 — End: 2023-03-26

## 2023-03-09 NOTE — Addendum Note (Signed)
Addended by: Marcene Duos on: 03/09/2023 10:32 AM   Modules accepted: Orders
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
## 2023-03-09 NOTE — Telephone Encounter (Signed)
Patient has been seen.

## 2023-03-10 LAB — CBC WITH DIFFERENTIAL/PLATELET
Basophils Absolute: 0.1 10*3/uL (ref 0.0–0.2)
Basos: 1 %
EOS (ABSOLUTE): 0.4 10*3/uL (ref 0.0–0.4)
Eos: 4 %
Hematocrit: 44 % (ref 37.5–51.0)
Hemoglobin: 14.6 g/dL (ref 13.0–17.7)
Immature Grans (Abs): 0 10*3/uL (ref 0.0–0.1)
Immature Granulocytes: 0 %
Lymphocytes Absolute: 1.8 10*3/uL (ref 0.7–3.1)
Lymphs: 18 %
MCH: 31.5 pg (ref 26.6–33.0)
MCHC: 33.2 g/dL (ref 31.5–35.7)
MCV: 95 fL (ref 79–97)
Monocytes Absolute: 0.6 10*3/uL (ref 0.1–0.9)
Monocytes: 6 %
Neutrophils Absolute: 6.8 10*3/uL (ref 1.4–7.0)
Neutrophils: 71 %
Platelets: 266 10*3/uL (ref 150–450)
RBC: 4.64 x10E6/uL (ref 4.14–5.80)
RDW: 12 % (ref 11.6–15.4)
WBC: 9.7 10*3/uL (ref 3.4–10.8)

## 2023-03-10 LAB — MICROALBUMIN / CREATININE URINE RATIO
Creatinine, Urine: 152.3 mg/dL
Microalb/Creat Ratio: 58 mg/g{creat} — ABNORMAL HIGH (ref 0–29)
Microalbumin, Urine: 88.3 ug/mL

## 2023-03-10 LAB — LIPID PANEL W/O CHOL/HDL RATIO
Cholesterol, Total: 128 mg/dL (ref 100–199)
HDL: 45 mg/dL (ref >39)
LDL Chol Calc (NIH): 70 mg/dL (ref 0–99)
Triglycerides: 63 mg/dL (ref 0–149)
VLDL Cholesterol Cal: 13 mg/dL (ref 5–40)

## 2023-03-10 LAB — COMPREHENSIVE METABOLIC PANEL
ALT: 22 [IU]/L (ref 0–44)
AST: 20 [IU]/L (ref 0–40)
Albumin: 4.7 g/dL (ref 3.9–4.9)
Alkaline Phosphatase: 78 [IU]/L (ref 44–121)
BUN/Creatinine Ratio: 14 (ref 10–24)
BUN: 13 mg/dL (ref 8–27)
Bilirubin Total: 0.7 mg/dL (ref 0.0–1.2)
CO2: 24 mmol/L (ref 20–29)
Calcium: 9.6 mg/dL (ref 8.6–10.2)
Chloride: 101 mmol/L (ref 96–106)
Creatinine, Ser: 0.92 mg/dL (ref 0.76–1.27)
Globulin, Total: 2.1 g/dL (ref 1.5–4.5)
Glucose: 131 mg/dL — ABNORMAL HIGH (ref 70–99)
Potassium: 4 mmol/L (ref 3.5–5.2)
Sodium: 139 mmol/L (ref 134–144)
Total Protein: 6.8 g/dL (ref 6.0–8.5)
eGFR: 92 mL/min/{1.73_m2} (ref >59)

## 2023-03-10 LAB — HGB A1C W/O EAG: Hgb A1c MFr Bld: 7.2 % — ABNORMAL HIGH (ref 4.8–5.6)

## 2023-03-18 ENCOUNTER — Ambulatory Visit: Payer: 59 | Admitting: Internal Medicine

## 2023-03-18 DIAGNOSIS — Z23 Encounter for immunization: Secondary | ICD-10-CM

## 2023-03-24 ENCOUNTER — Other Ambulatory Visit: Payer: 59

## 2023-03-24 VITALS — BP 118/78 | HR 94

## 2023-03-24 DIAGNOSIS — Z0130 Encounter for examination of blood pressure without abnormal findings: Secondary | ICD-10-CM | POA: Diagnosis not present

## 2023-03-24 NOTE — Progress Notes (Signed)
Patient reports that he is taking bp medication consistently.
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
## 2023-03-26 ENCOUNTER — Ambulatory Visit: Payer: 59 | Attending: Interventional Cardiology | Admitting: Nurse Practitioner

## 2023-03-26 ENCOUNTER — Encounter: Payer: Self-pay | Admitting: Nurse Practitioner

## 2023-03-26 VITALS — BP 122/62 | HR 90 | Ht 72.0 in | Wt 220.0 lb

## 2023-03-26 DIAGNOSIS — E119 Type 2 diabetes mellitus without complications: Secondary | ICD-10-CM

## 2023-03-26 DIAGNOSIS — I2510 Atherosclerotic heart disease of native coronary artery without angina pectoris: Secondary | ICD-10-CM

## 2023-03-26 DIAGNOSIS — I10 Essential (primary) hypertension: Secondary | ICD-10-CM | POA: Diagnosis not present

## 2023-03-26 DIAGNOSIS — E782 Mixed hyperlipidemia: Secondary | ICD-10-CM | POA: Diagnosis not present

## 2023-03-26 NOTE — Patient Instructions (Addendum)
Medication Instructions:  Your physician recommends that you continue on your current medications as directed. Please refer to the Current Medication list given to you today. *If you need a refill on your cardiac medications before your next appointment, please call your pharmacy*   Lab Work: None ordered   Testing/Procedures: None ordered   Follow-Up: At [facility], you and your health needs are our priority.  As part of our continuing mission to provide you with exceptional heart care, we have created designated Provider Care Teams.  These Care Teams include your primary Cardiologist (physician) and Advanced Practice Providers (APPs -  Physician Assistants and Nurse Practitioners) who all work together to provide you with the care you need, when you need it.  We recommend signing up for the patient portal called "MyChart".  Sign up information is provided on this After Visit Summary.  MyChart is used to connect with patients for Virtual Visits (Telemedicine).  Patients are able to view lab/test results, encounter notes, upcoming appointments, etc.  Non-urgent messages can be sent to your provider as well.   To learn more about what you can do with MyChart, go to [email or web address].    Your next appointment:   6 month(s)  Provider:   ANY DOCTOR THAT IS ACCEPTING NEW PATIENTS      Other Instructions

## 2023-04-19 ENCOUNTER — Other Ambulatory Visit: Payer: Self-pay | Admitting: Internal Medicine

## 2023-04-19 DIAGNOSIS — M545 Other chronic pain: Secondary | ICD-10-CM

## 2023-04-19 DIAGNOSIS — G8929 Other chronic pain: Secondary | ICD-10-CM

## 2023-04-19 DIAGNOSIS — M2550 Pain in unspecified joint: Secondary | ICD-10-CM

## 2023-04-19 MED ORDER — EMPAGLIFLOZIN-METFORMIN HCL 5-500 MG PO TABS: ORAL_TABLET | ORAL | 11 refills | Status: DC

## 2023-04-21 ENCOUNTER — Ambulatory Visit (INDEPENDENT_AMBULATORY_CARE_PROVIDER_SITE_OTHER): Payer: 59 | Admitting: Podiatry

## 2023-04-21 ENCOUNTER — Encounter: Payer: Self-pay | Admitting: Podiatry

## 2023-04-21 DIAGNOSIS — B351 Tinea unguium: Secondary | ICD-10-CM

## 2023-04-21 DIAGNOSIS — M79674 Pain in right toe(s): Secondary | ICD-10-CM | POA: Diagnosis not present

## 2023-04-21 DIAGNOSIS — E1142 Type 2 diabetes mellitus with diabetic polyneuropathy: Secondary | ICD-10-CM

## 2023-04-21 DIAGNOSIS — M79675 Pain in left toe(s): Secondary | ICD-10-CM | POA: Diagnosis not present

## 2023-04-21 NOTE — Progress Notes (Signed)
This patient presents the office for treatment of his long painful toenails both feet.  Patient states that he has back problems and he is unable to self treat his nails.  Patient states the he is diabetic..  He presents the office today for  nail care.  General Appearance  Alert, conversant and in no acute stress.  Vascular  Dorsalis pedis and posterior tibial  pulses are  weakly  palpable  bilaterally.  Capillary return is within normal limits  bilaterally. Temperature is within normal limits  bilaterally.  Neurologic  Senn-Weinstein monofilament wire test within normal limits  bilaterally. Muscle power within normal limits bilaterally.  Nails Thick disfigured discolored nails with subungual debris  from hallux to fifth toes bilaterally.  Orthopedic  No limitations of motion  feet .  No crepitus or effusions noted.  No bony pathology or digital deformities noted. Hammer toe 2-5  B/L.  Hallux malleus  B/L  Skin  normotropic skin with no porokeratosis noted bilaterally.  No signs of infections or ulcers noted.    Onychomycosis  X 10.  Diabetes  ROV.  Debride nails.  Discussed neuropathy with this patient RTC 3 months.    Elody Kleinsasser DPM 

## 2023-05-06 ENCOUNTER — Telehealth: Payer: Self-pay

## 2023-05-06 ENCOUNTER — Encounter: Payer: Self-pay | Admitting: Internal Medicine

## 2023-05-06 ENCOUNTER — Ambulatory Visit (INDEPENDENT_AMBULATORY_CARE_PROVIDER_SITE_OTHER): Payer: 59 | Admitting: Internal Medicine

## 2023-05-06 VITALS — BP 138/82 | HR 100 | Resp 20 | Ht 72.0 in | Wt 217.0 lb

## 2023-05-06 DIAGNOSIS — L84 Corns and callosities: Secondary | ICD-10-CM

## 2023-05-06 NOTE — Telephone Encounter (Signed)
 Patients wife called to report that patient has a mass growing on the bottom of his foot near his heal. Mass seems to be fluid filled, but unsure what the fluid is. Pain to talk on. Has had this for 2 weeks.

## 2023-05-06 NOTE — Progress Notes (Signed)
 Subjective:    Patient ID: Rodman Recupero., male   DOB: 04-25-1956, 68 y.o.   MRN: [id number]   HPI  Right foot/heel with pain for about 3 weeks.  No worsening.  No wetness or drainage.  Cannot say shoes rubbed there.  Just hurts, especially when pressure on side of heel when trying to sleep at night.  .    Current Meds  Medication Sig   amLODipine  (NORVASC ) 5 MG tablet Take 1 tablet by mouth once daily   aspirin  EC 81 MG tablet Take 1 tablet (81 mg total) by mouth daily. Swallow whole.   atorvastatin  (LIPITOR) 80 MG tablet TAKE 1 TABLET BY MOUTH ONCE DAILY WITH EVENING MEAL   Empagliflozin -metFORMIN  HCl 5-500 MG TABS 1 tab by mouth twice daily with meal   EPINEPHrine  (EPIPEN  2-PAK) 0.3 mg/0.3 mL IJ SOAJ injection Inject 0.3 mg into the muscle as needed for anaphylaxis.   hydrochlorothiazide  (HYDRODIURIL ) 25 MG tablet Take 1 tablet (25 mg total) by mouth daily. TAKE WITH  LOSARTAN    losartan  (COZAAR ) 50 MG tablet Take 2 tablets (100 mg total) by mouth daily.   nitroGLYCERIN  (NITROSTAT ) 0.4 MG SL tablet Place 1 tablet (0.4 mg total) under the tongue every 5 (five) minutes as needed for chest pain.   Allergies  Allergen Reactions   Cherry Extract Anaphylaxis, Hives, Swelling and Rash   Fruit & Vegetable Daily [Nutritional Supplements] Anaphylaxis, Hives, Swelling and Rash    Fruit cocktail: Lips swell   Other Anaphylaxis and Swelling    NO NUTS!!!!   Peach [Prunus Persica] Anaphylaxis and Swelling   Pecan Nut (Diagnostic) Shortness Of Breath and Swelling    Throat and tongue swelling   Penicillins Anaphylaxis and Swelling    Has patient had a PCN reaction causing immediate rash, facial/tongue/throat swelling, SOB or lightheadedness with hypotension: Yes Has patient had a PCN reaction causing severe rash involving mucus membranes or skin necrosis: No Has patient had a PCN reaction that required hospitalization: No Has patient had a PCN reaction occurring within the last 10  years: No If all of the above answers are NO, then may proceed with Cephalosporin use.    Pineapple Anaphylaxis, Hives and Swelling   Lisinopril Cough     Review of Systems    Objective:   BP 138/82 (BP Location: Right Arm, Patient Position: Sitting, Cuff Size: Normal)   Pulse 100   Resp 20   Ht 6' (1.829 m)   Wt 217 lb (98.4 kg)   BMI 29.43 kg/m   Physical Exam Not wearing socks with dress shoes. Feet and ankles generally dry and flaking. Callus formation around edges of feet, particularly heels with cracks/fissuring.  Deeper fissure at area of medial right heel where he has tenderness that is fissured to underlying new skin.  No bleeding, erythema, discharge or swelling.   He is unable to bring his foot up on top of knee.    Assessment & Plan  Right heel callus with fissure and pain:  No infection apparent currently.   Discussed improved foot care.  He did not bring this up at recent visit to Podiatry, Dr. Loreda, as he thought they just addressed his toenails. Discussed how to mix Gold Bond foot cream with Tea tree oil and apply twice daily, working into dry callused areas in particular.  He thinks he can get his wife or daughter to apply as unable to reach his feet.   Discussed best time is after his shower--pat  feet dry and apply White cotton socks to wear after application.   Not to go without socks with his shoes as well. I will notify Dr. Loreda as perhaps can gently debride callus down

## 2023-05-06 NOTE — Telephone Encounter (Signed)
 Patient has been seen.

## 2023-05-06 NOTE — Patient Instructions (Addendum)
 Tea tree oil mixed with Gold Bond Foot cream and work into callused areas of feet twice daily, especially immediately after daily shower.   Put on clean white cotton socks after applying cream and oil.

## 2023-05-21 ENCOUNTER — Other Ambulatory Visit: Payer: 59

## 2023-05-21 ENCOUNTER — Encounter: Payer: Self-pay | Admitting: Gastroenterology

## 2023-05-21 ENCOUNTER — Encounter: Payer: Self-pay | Admitting: Physical Medicine and Rehabilitation

## 2023-05-21 DIAGNOSIS — M546 Pain in thoracic spine: Secondary | ICD-10-CM | POA: Diagnosis not present

## 2023-05-21 DIAGNOSIS — G8929 Other chronic pain: Secondary | ICD-10-CM

## 2023-05-21 DIAGNOSIS — M545 Low back pain, unspecified: Secondary | ICD-10-CM

## 2023-05-26 LAB — HLA-B27 ANTIGEN: HLA B27: NEGATIVE

## 2023-06-07 ENCOUNTER — Other Ambulatory Visit: Payer: Self-pay

## 2023-06-07 MED ORDER — LOSARTAN POTASSIUM 50 MG PO TABS: 100.0000 mg | ORAL_TABLET | Freq: Every day | ORAL | 11 refills | Status: DC

## 2023-06-09 ENCOUNTER — Other Ambulatory Visit: Payer: 59

## 2023-06-09 DIAGNOSIS — G8929 Other chronic pain: Secondary | ICD-10-CM | POA: Diagnosis not present

## 2023-06-09 DIAGNOSIS — M545 Low back pain, unspecified: Secondary | ICD-10-CM

## 2023-06-14 ENCOUNTER — Encounter: Payer: 59 | Attending: Physical Medicine and Rehabilitation | Admitting: Physical Medicine and Rehabilitation

## 2023-06-14 VITALS — BP 138/81 | HR 88 | Ht 72.0 in | Wt 220.0 lb

## 2023-06-14 DIAGNOSIS — G894 Chronic pain syndrome: Secondary | ICD-10-CM | POA: Diagnosis not present

## 2023-06-14 DIAGNOSIS — M542 Cervicalgia: Secondary | ICD-10-CM | POA: Insufficient documentation

## 2023-06-14 DIAGNOSIS — M5441 Lumbago with sciatica, right side: Secondary | ICD-10-CM | POA: Diagnosis present

## 2023-06-14 DIAGNOSIS — G8929 Other chronic pain: Secondary | ICD-10-CM | POA: Insufficient documentation

## 2023-06-14 DIAGNOSIS — M5412 Radiculopathy, cervical region: Secondary | ICD-10-CM | POA: Diagnosis present

## 2023-06-14 DIAGNOSIS — M5442 Lumbago with sciatica, left side: Secondary | ICD-10-CM | POA: Diagnosis present

## 2023-06-14 DIAGNOSIS — S32010A Wedge compression fracture of first lumbar vertebra, initial encounter for closed fracture: Secondary | ICD-10-CM | POA: Diagnosis not present

## 2023-06-14 MED ORDER — METHYLPREDNISOLONE 4 MG PO TBPK: ORAL_TABLET | ORAL | 0 refills | Status: DC

## 2023-06-14 MED ORDER — METHOCARBAMOL 750 MG PO TABS
750.0000 mg | ORAL_TABLET | Freq: Three times a day (TID) | ORAL | 3 refills | Status: AC | PRN
Start: 2023-06-14 — End: ?

## 2023-06-14 MED ORDER — MELOXICAM 15 MG PO TABS: 15.0000 mg | ORAL_TABLET | Freq: Every evening | ORAL | 0 refills | Status: AC
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
## 2023-06-14 NOTE — Patient Instructions (Addendum)
 Referral sent for PT for neck and low back pain and arthritis; follow up in 6 weeks and if pain is not improved with this we will get an MRI of your neck and an EMG of your hands to look for possible nerve impingement.  Start meloxicam 15 mg nightly for 2 weeks; stop ibuprofen while on this medication. You can use Tylenol up to 2000 mg daily.  Stop baclofen. Start robaxin 750 mg three times daily as needed for muscle spasms and pain.   I recommend over the counter voltaren gel for your low back and neck, as well as warm heat for extra pain control  We did discuss possible steroid dosepack for pain but it will increase his sugars; patient wanted anyway for pain control prior to PT, so script sent without refill

## 2023-06-16 LAB — C-REACTIVE PROTEIN: CRP: 2 mg/L (ref 0–10)

## 2023-06-16 LAB — ANTINUCLEAR ANTIBODIES, IFA: ANA Titer 1: NEGATIVE

## 2023-06-17 ENCOUNTER — Ambulatory Visit (AMBULATORY_SURGERY_CENTER): Payer: 59 | Admitting: *Deleted

## 2023-06-17 VITALS — Ht 72.0 in | Wt 220.0 lb

## 2023-06-17 DIAGNOSIS — Z860100 Personal history of colon polyps, unspecified: Secondary | ICD-10-CM

## 2023-06-17 DIAGNOSIS — Z800 Family history of malignant neoplasm of digestive organs: Secondary | ICD-10-CM

## 2023-06-17 MED ORDER — SUFLAVE 178.7 G PO SOLR: 1.0000 | Freq: Once | ORAL | 0 refills | Status: AC

## 2023-06-17 NOTE — Progress Notes (Signed)
Pt's name and DOB verified at the beginning of the pre-visit wit 2 identifiers  Permission given to speak with Wife and daughter in room  Pt denies any difficulty with ambulating,sitting, laying down or rolling side to side  Pt has no issues with ambulation   Pt has no issues moving head neck or swallowing  No egg or soy allergy known to patient   No issues known to pt with past sedation with any surgeries or procedures  Pt denies having issues being intubated  No FH of Malignant Hyperthermia  Pt is not on diet pills or shots  Pt is not on home 02   Pt is not on blood thinners   Pt denies issues with constipation   Pt is not on dialysis  Pt denise any abnormal heart rhythms   Pt denies any upcoming cardiac testing  Patient's chart reviewed by Cathlyn Parsons CNRA prior to pre-visit and patient appropriate for the [facility].  Pre-visit completed and red dot placed by patient's name on their procedure day (on provider's schedule).    Chart not reviewed by CRNA prior to [facility]  Visit in person   Pt Scale weight is 220lb  IInstructions reviewed. Pt given [facility], [facility] main # and MD on call # prior to instructions.  Pt states understanding of instructions. Instructed to review again prior to procedure. Pt states they will.   Informed pt that they will receive a text or  call from [facility] regarding there prep med. Instructions given to pt as well as [facility] information

## 2023-06-18 ENCOUNTER — Other Ambulatory Visit: Payer: 59

## 2023-06-20 ENCOUNTER — Encounter: Payer: Self-pay | Admitting: Internal Medicine

## 2023-06-23 ENCOUNTER — Ambulatory Visit: Payer: 59 | Admitting: Podiatry

## 2023-07-09 ENCOUNTER — Encounter: Payer: 59 | Admitting: Gastroenterology

## 2023-07-22 ENCOUNTER — Other Ambulatory Visit (INDEPENDENT_AMBULATORY_CARE_PROVIDER_SITE_OTHER): Payer: 59

## 2023-07-22 DIAGNOSIS — E118 Type 2 diabetes mellitus with unspecified complications: Secondary | ICD-10-CM
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
## 2023-07-23 ENCOUNTER — Encounter: Payer: Self-pay | Admitting: Internal Medicine

## 2023-07-23 LAB — HEMOGLOBIN A1C
Est. average glucose Bld gHb Est-mCnc: 148 mg/dL
Hgb A1c MFr Bld: 6.8 % — ABNORMAL HIGH (ref 4.8–5.6)

## 2023-07-26 ENCOUNTER — Encounter: Payer: 59 | Attending: Physical Medicine and Rehabilitation | Admitting: Physical Medicine and Rehabilitation

## 2023-07-26 DIAGNOSIS — G8929 Other chronic pain: Secondary | ICD-10-CM | POA: Insufficient documentation

## 2023-07-26 DIAGNOSIS — M5442 Lumbago with sciatica, left side: Secondary | ICD-10-CM | POA: Insufficient documentation

## 2023-07-26 DIAGNOSIS — M5441 Lumbago with sciatica, right side: Secondary | ICD-10-CM | POA: Insufficient documentation

## 2023-07-26 DIAGNOSIS — M542 Cervicalgia: Secondary | ICD-10-CM | POA: Insufficient documentation

## 2023-07-26 DIAGNOSIS — M5412 Radiculopathy, cervical region: Secondary | ICD-10-CM | POA: Insufficient documentation

## 2023-07-26 DIAGNOSIS — S32010A Wedge compression fracture of first lumbar vertebra, initial encounter for closed fracture: Secondary | ICD-10-CM | POA: Insufficient documentation

## 2023-07-26 DIAGNOSIS — G894 Chronic pain syndrome: Secondary | ICD-10-CM | POA: Insufficient documentation

## 2023-07-28 ENCOUNTER — Ambulatory Visit: Payer: 59 | Admitting: Podiatry

## 2023-07-28 ENCOUNTER — Ambulatory Visit (INDEPENDENT_AMBULATORY_CARE_PROVIDER_SITE_OTHER): Admitting: Podiatry

## 2023-07-28 ENCOUNTER — Encounter: Payer: Self-pay | Admitting: Podiatry

## 2023-07-28 DIAGNOSIS — M79674 Pain in right toe(s): Secondary | ICD-10-CM | POA: Diagnosis not present

## 2023-07-28 DIAGNOSIS — M79675 Pain in left toe(s): Secondary | ICD-10-CM | POA: Diagnosis not present

## 2023-07-28 DIAGNOSIS — B351 Tinea unguium: Secondary | ICD-10-CM | POA: Diagnosis not present

## 2023-07-28 DIAGNOSIS — L84 Corns and callosities: Secondary | ICD-10-CM | POA: Diagnosis not present

## 2023-07-28 DIAGNOSIS — E1142 Type 2 diabetes mellitus with diabetic polyneuropathy: Secondary | ICD-10-CM

## 2023-07-28 NOTE — Progress Notes (Signed)
 This patient presents the office for treatment of his long painful toenails both feet.  Patient states that he has back problems and he is unable to self treat his nails.  Patient states the he is diabetic.Marland Kitchen  He presents the office today for  nail care.  General Appearance  Alert, conversant and in no acute stress.  Vascular  Dorsalis pedis and posterior tibial  pulses are  weakly  palpable  bilaterally.  Capillary return is within normal limits  bilaterally. Temperature is within normal limits  bilaterally.  Neurologic  Senn-Weinstein monofilament wire test within normal limits  bilaterally. Muscle power within normal limits bilaterally.  Nails Thick disfigured discolored nails with subungual debris  from hallux to fifth toes bilaterally.  Orthopedic  No limitations of motion  feet .  No crepitus or effusions noted.  No bony pathology or digital deformities noted. Hammer toe 2-5  B/L.  Hallux malleus  B/L  Skin  normotropic skin with no porokeratosis noted bilaterally.  No signs of infections or ulcers noted.  Heel fissuring/peeling both heels.  Told him to apply vaseline to heels at home.  Onychomycosis  X 10.   Heel callus   Diabetes    Debride nails. With nail nipper.  Dremel tool used to smooth heel callus/fissuring  B/L.  RTC 3 months.    Helane Gunther DPM

## 2023-08-12 ENCOUNTER — Ambulatory Visit: Admitting: Podiatry

## 2023-09-06 ENCOUNTER — Other Ambulatory Visit: Payer: 59

## 2023-09-09 ENCOUNTER — Ambulatory Visit: Payer: 59 | Admitting: Internal Medicine

## 2023-09-21 ENCOUNTER — Encounter: Payer: 59 | Admitting: Internal Medicine

## 2023-09-22 ENCOUNTER — Ambulatory Visit: Payer: 59 | Attending: Cardiology | Admitting: Cardiology

## 2023-09-22 NOTE — Progress Notes (Deleted)
 Cardiology Office Note:  .   Date:  09/22/2023  ID:  Spencer Walton., DOB 03-03-1956, MRN [id number] PCP: Ronalee Cocking, MD  [facility] Providers Cardiologist:  Avery Bodo, MD { Click to update primary MD,subspecialty MD or APP then REFRESH:1}  History of Present Illness: .   Spencer Walton. is a 68 y.o. 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  History of Present Illness    Labs   Lab Results  Component Value Date   CHOL 128 03/09/2023   HDL 45 03/09/2023   LDLCALC 70 03/09/2023   TRIG 63 03/09/2023   Lab Results  Component Value Date   NA 139 03/09/2023   K 4.0 03/09/2023   CO2 24 03/09/2023   GLUCOSE 131 (H) 03/09/2023   BUN 13 03/09/2023   CREATININE 0.92 03/09/2023   CALCIUM  9.6 03/09/2023   EGFR 92 03/09/2023   GFRNONAA >60 07/06/2020      Latest Ref Rng & Units 03/09/2023    9:51 AM 06/05/2022   11:22 AM 11/24/2021   11:17 AM  BMP  Glucose 70 - 99 mg/dL 409  811  914   BUN 8 - 27 mg/dL 13  15  14    Creatinine 0.76 - 1.27 mg/dL 7.82  9.56  2.13   BUN/Creat Ratio 10 - 24 14  16  13    Sodium 134 - 144 mmol/L 139  137  140   Potassium 3.5 - 5.2 mmol/L 4.0  3.8  4.1   Chloride 96 - 106 mmol/L 101  100  99   CO2 20 - 29 mmol/L 24  21  19    Calcium  8.6 - 10.2 mg/dL 9.6  9.2  08.6       Latest Ref Rng & Units 03/09/2023    9:51 AM 06/05/2022   11:22 AM 11/24/2021   11:17 AM  CBC  WBC 3.4 - 10.8 x10E3/uL 9.7  5.0  6.0   Hemoglobin 13.0 - 17.7 g/dL 57.8  46.9  62.9   Hematocrit 37.5 - 51.0 % 44.0  40.5  42.4   Platelets 150 - 450 x10E3/uL 266  232  230    Lab Results   Component Value Date   HGBA1C 6.8 (H) 07/22/2023    No results found for: "TSH"    ROS  ***ROS  Physical Exam:   VS:  There were no vitals taken for this visit.   Wt Readings from Last 3 Encounters:  06/17/23 220 lb (99.8 kg)  06/14/23 220 lb (99.8 kg)  05/06/23 217 lb (98.4 kg)    ***Physical Exam Studies Reviewed: Aaron Aas    ECHOCARDIOGRAM COMPLETE 10/08/2022  1. Left ventricular ejection fraction, by estimation, is 65 to 70%. Left ventricular ejection fraction by PLAX is 67 %. The left ventricle has normal function. The left ventricle has no regional wall motion abnormalities. Left ventricular diastolic parameters are consistent with Grade I diastolic dysfunction (impaired relaxation). 2. Right ventricular systolic function is normal. The right ventricular size is normal. Tricuspid regurgitation signal is inadequate for assessing PA pressure. 3. The mitral valve is abnormal. Trivial mitral valve regurgitation. 4. The aortic valve is  tricuspid. Aortic valve regurgitation is not visualized. 5. The inferior vena cava is normal in size with greater than 50% respiratory variability, suggesting right atrial pressure of 3 mmHg.  CARDIAC CATHETERIZATION 12/09/2021    EKG:         ***  Medications and allergies    Allergies  Allergen Reactions   Cherry Extract Anaphylaxis, Hives, Swelling and Rash   Fruit & Vegetable Daily [Nutritional Supplements] Anaphylaxis, Hives, Swelling and Rash    Fruit cocktail: Lips swell   Other Anaphylaxis and Swelling    NO NUTS!!!!   Peach [Prunus Persica] Anaphylaxis and Swelling   Pecan Nut (Diagnostic) Shortness Of Breath and Swelling    Throat and tongue swelling   Penicillins Anaphylaxis and Swelling    Has patient had a PCN reaction causing immediate rash, facial/tongue/throat swelling, SOB or lightheadedness with hypotension: Yes Has patient had a PCN reaction causing severe rash involving mucus membranes or skin necrosis: No Has patient had  a PCN reaction that required hospitalization: No Has patient had a PCN reaction occurring within the last 10 years: No If all of the above answers are "NO", then may proceed with Cephalosporin use.    Pineapple Anaphylaxis, Hives and Swelling   Lisinopril Cough     Current Outpatient Medications:    amLODipine  (NORVASC ) 5 MG tablet, Take 1 tablet by mouth once daily, Disp: 30 tablet, Rfl: 9   aspirin  EC 81 MG tablet, Take 1 tablet (81 mg total) by mouth daily. Swallow whole., Disp: 90 tablet, Rfl: 3   atorvastatin  (LIPITOR) 80 MG tablet, TAKE 1 TABLET BY MOUTH ONCE DAILY WITH EVENING MEAL, Disp: 90 tablet, Rfl: 1   Empagliflozin -metFORMIN  HCl 5-500 MG TABS, 1 tab by mouth twice daily with meal, Disp: 60 tablet, Rfl: 11   EPINEPHrine  (EPIPEN  2-PAK) 0.3 mg/0.3 mL IJ SOAJ injection, Inject 0.3 mg into the muscle as needed for anaphylaxis., Disp: 1 each, Rfl: 1   losartan  (COZAAR ) 50 MG tablet, Take 2 tablets (100 mg total) by mouth daily., Disp: 60 tablet, Rfl: 11   methocarbamol  (ROBAXIN -750) 750 MG tablet, Take 1 tablet (750 mg total) by mouth every 8 (eight) hours as needed for muscle spasms., Disp: 90 tablet, Rfl: 3   methylPREDNISolone  (MEDROL  DOSEPAK) 4 MG TBPK tablet, Take as instructed on packaging, Disp: 1 each, Rfl: 0   No orders of the defined types were placed in this encounter.    There are no discontinued medications.   ASSESSMENT AND PLAN: .      ICD-10-CM   1. Nonobstructive atherosclerosis of coronary artery  I25.10     2. Primary hypertension  I10     3. Hypercholesteremia  E78.00       Assessment and Plan Assessment & Plan      Signed,  Knox Perl, MD, [facility] 09/22/2023, 6:05 AM [facility] [address] [place], [place] [address] Phone: [phone]. Fax:  [phone]

## 2023-09-30 ENCOUNTER — Encounter: Payer: 59 | Admitting: Internal Medicine
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
## 2023-10-01 ENCOUNTER — Other Ambulatory Visit: Payer: Self-pay | Admitting: Internal Medicine

## 2023-10-01 ENCOUNTER — Other Ambulatory Visit: Payer: Self-pay | Admitting: Nurse Practitioner

## 2023-10-01 ENCOUNTER — Other Ambulatory Visit: Payer: Self-pay | Admitting: Physical Medicine and Rehabilitation

## 2023-10-27 ENCOUNTER — Ambulatory Visit: Payer: 59 | Admitting: Podiatry

## 2023-10-28 ENCOUNTER — Ambulatory Visit: Admitting: Podiatry

## 2023-11-01 ENCOUNTER — Telehealth: Payer: Self-pay | Admitting: Nurse Practitioner

## 2023-11-01 ENCOUNTER — Telehealth: Payer: Self-pay | Admitting: Internal Medicine

## 2023-11-01 NOTE — Telephone Encounter (Signed)
 Patient's wife called and states that patient needs appointment for patient has been having chest pain and irregular heart beat . Symptoms have been for the past 3 months . Patient's wife suspects it could be due to patient taking 3 different medications for high blood pressure.  Patient has not checked his blood pressure when is feeling symptoms.   We will call patient when have appointment available.

## 2023-11-01 NOTE — Telephone Encounter (Signed)
 Called patient at both numbers listed on chart. At [phone] I called twice, both times it seemed like someone picked up but then hung up. The other # listed in chart had a full mailbox. Also sent [facility] message.

## 2023-11-01 NOTE — Telephone Encounter (Signed)
 Pt c/o of Chest Pain: STAT if active (IN THIS MOMENT) CP, including tightness, pressure, jaw pain, shoulder/upper arm/back pain, SOB, nausea, and vomiting.  1. Are you having CP right now (tightness, pressure, or discomfort)?  No   2. Are you experiencing any other symptoms (ex. SOB, nausea, vomiting, sweating)?  No   3. How long have you been experiencing CP?  Complaining of CP for 2-3 months  4. Is your CP continuous or coming and going?  Coming and going  5. Have you taken Nitroglycerin ?  No

## 2023-11-02 MED ORDER — NITROGLYCERIN 0.4 MG SL SUBL: 0.4000 mg | SUBLINGUAL_TABLET | SUBLINGUAL | 3 refills | Status: DC | PRN

## 2023-11-02 NOTE — Telephone Encounter (Signed)
 Spoke with the patients daughter and made her aware of providers recommendations. She voiced understanding.

## 2023-11-02 NOTE — Telephone Encounter (Signed)
 Spoke with pt and his wife to discuss the chest pain episodes he has been having. Pt stated that the chest pain comes and goes but is with activity. Started reconciling medications with pt and he was upset that he was on 3 blood pressure medications. Explained to pt that the only BP medications he is currently on are Amlodipine  and Losartan  but that the Losartan  had been prescribed by his PCP. Pt stated he is also taking hydrochlorothiazide  however, this is not a current medication on the pt's med list. It appears it was discontinued on 06/14/23 by Candelaria Neer, CMA. Advised pt to call PCP office about this medication to see if they were the ones to discontinue the medication d/t the CMA not working in our office. Call disconnected mid-conversation. Called the number back and spoke with the pt's daughter. Pt's daughter stated that pt has not been feeling good and feels that the chest pain is regarding the blood pressure medications. Explained to pt's daughter to call Dr. Felix office to discuss the Losartan  and dosing and to discuss if hydrochlorothiazide  needs to be continued since it was stopped in February. Explained to pt that I would forward this information to Jackee Alberts, NP to discuss Amlodipine . ED precautions advised with pt's daughter. Pt's daughter verbalized understanding of information from this call and had no further questions at this time.

## 2023-11-02 NOTE — Telephone Encounter (Signed)
 Patient was returning call. Please advise ?

## 2023-11-02 NOTE — Addendum Note (Signed)
 Addended by: SEBASTIAN JANESE GRADE on: 11/02/2023 05:23 PM   Modules accepted: Orders

## 2023-11-03 NOTE — Telephone Encounter (Signed)
 Received voicemail from patient's wife. Mrs. Spencer Walton states that she called patient's cardiologist and patient was told to call his primary care doctor to tell doctor about the three high blood pressure medications that he is on, cardiologist told patient that one of the medications is discontinued and one is a very high dosage and needs to be lowered as that could be the reason for his chest pain ,dried chronic cough and palpitations.  -called patient , spoke with daughter Spencer Walton and notify her we received her message and will notify Doctor Adella , will call patient back once have an answer.

## 2023-11-04 NOTE — Telephone Encounter (Signed)
 Called patient and spoke with daughter, asked daughter if patient went to see his cardiologist. Daughter's patient states he did not go but he called his cardiologist and told him the symptoms he was having and that he was on 4 high blood pressure medication and thinks that he needs to be off one of the medications or lowered because he is currently taking a high dosage of losartan . Asked daughter who thinks that if cardiologist or patient , daughter states patient.  I asked if patient asked for an appointment with cardiologist, daughter stated no, I mentioned to her that doctor mulberry ask for patient to make an appointment with cardiologist soon, daughter states (with a loud voice mad) that why he needs to go there because she believes he is feeling that way because of all the blood pressure medications patient is on and he needs to be at a lowered dose for one of them and the other one is discontinued.  At the end patient's daughter states that cardiologist did agree that patient is taking a high dose of losartan  and it needs to be lowered.  Notified daughter we will be closed tomorrow but we will bring patient if we get a cancellation for beginning of next week.

## 2023-11-04 NOTE — Telephone Encounter (Signed)
 Patient has been scheduled for 11/08/23 at 4:00 PM

## 2023-11-08 ENCOUNTER — Encounter: Payer: Self-pay | Admitting: Internal Medicine

## 2023-11-08 ENCOUNTER — Ambulatory Visit (INDEPENDENT_AMBULATORY_CARE_PROVIDER_SITE_OTHER): Admitting: Internal Medicine

## 2023-11-08 VITALS — BP 150/70 | HR 22 | Resp 20 | Ht 72.0 in | Wt 212.8 lb

## 2023-11-08 DIAGNOSIS — R051 Acute cough: Secondary | ICD-10-CM | POA: Diagnosis not present

## 2023-11-08 DIAGNOSIS — R079 Chest pain, unspecified: Secondary | ICD-10-CM | POA: Diagnosis not present

## 2023-11-08 DIAGNOSIS — I10 Essential (primary) hypertension: Secondary | ICD-10-CM

## 2023-11-08 DIAGNOSIS — E118 Type 2 diabetes mellitus with unspecified complications: Secondary | ICD-10-CM | POA: Diagnosis not present

## 2023-11-08 MED ORDER — AMLODIPINE BESYLATE 5 MG PO TABS: 5.0000 mg | ORAL_TABLET | Freq: Every day | ORAL | 3 refills | Status: DC

## 2023-11-08 MED ORDER — MOXIFLOXACIN HCL 400 MG PO TABS
400.0000 mg | ORAL_TABLET | Freq: Every day | ORAL | 0 refills | Status: DC
Start: 2023-11-08 — End: 2024-03-09

## 2023-11-08 MED ORDER — LOSARTAN POTASSIUM 100 MG PO TABS: 100.0000 mg | ORAL_TABLET | Freq: Every day | ORAL | 3 refills | Status: AC

## 2023-11-08 NOTE — Progress Notes (Signed)
 Subjective:    Patient ID: Spencer Castilleja., male   DOB: 09-Mar-1956, 68 y.o.   MRN: [id number]   HPI  Difficult getting a good history today--lot of contradiction.  Left anterior chest discomfort for past 2 weeks more prominently but wife states perhaps off and on since January.  He cannot describe the discomfort--a sense of something moving in his left chest.   States generally occurs after he comes inside from strenuous lawn work--pushing a lawn mower through 3-4 yards in his neighborhood.   Lasts about 10 minutes and then gradually resolves if he sits and relaxes. However, does state if he gets up and starts doing something in the house, does not make the pain worse.  Also, does not prolong the pain.     May have palpitations with the discomfort, but he is not clear about that today.   Wife states he has complained about 4 times in the past 2 weeks of the discomfort.   May have had light headedness with one episode, but patient states it was just from being out in sun--was not having chest discomfort or palpitations at the time.   No radiation of the discomfort.   No associated dyspnea.   He feels he is on too much medication for his BP and apparently, stopped Losartan  100 mg daily 4 days ago.   His hydrochlorothiazide  was discontinued by a CMA at physical therapy, likely as he could not say he was taking it rather than a recommendation from the provider based on what I can see.  That was back on 06/14/2023.  He apparently did continue to take until was told by cardiology (about a week ago) it had been discontinued from his chart in February.  He ran out of his amlodipine  this morning.    Cough:  has been coughing up yellow mucous maybe since the chest discomfort started 2 weeks ago.  Cough worse at night--feels something in his throat.  Not clear if any posterior pharyngeal drainage. No sinus pressure or pain. No fever, dyspnea, just productive cough.  He does not believe he gets  the chest discomfort from coughing.     Current Meds  Medication Sig   amLODipine  (NORVASC ) 5 MG tablet Take 1 tablet by mouth once daily   aspirin  EC 81 MG tablet Take 1 tablet (81 mg total) by mouth daily. Swallow whole.   atorvastatin  (LIPITOR) 80 MG tablet TAKE 1 TABLET BY MOUTH ONCE DAILY WITH EVENING MEAL   Empagliflozin -metFORMIN  HCl 5-500 MG TABS 1 tab by mouth twice daily with meal   EPINEPHrine  (EPIPEN  2-PAK) 0.3 mg/0.3 mL IJ SOAJ injection Inject 0.3 mg into the muscle as needed for anaphylaxis.   losartan  (COZAAR ) 50 MG tablet Take 2 tablets (100 mg total) by mouth daily.   methocarbamol  (ROBAXIN -750) 750 MG tablet Take 1 tablet (750 mg total) by mouth every 8 (eight) hours as needed for muscle spasms.   nitroGLYCERIN  (NITROSTAT ) 0.4 MG SL tablet Place 1 tablet (0.4 mg total) under the tongue every 5 (five) minutes as needed.   Allergies  Allergen Reactions   Cherry Extract Anaphylaxis, Hives, Swelling and Rash   Fruit & Vegetable Daily [Nutritional Supplements] Anaphylaxis, Hives, Swelling and Rash    Fruit cocktail: Lips swell   Other Anaphylaxis and Swelling    NO NUTS!!!!   Peach [Prunus Persica] Anaphylaxis and Swelling   Pecan Nut (Diagnostic) Shortness Of Breath and Swelling    Throat and tongue swelling   Penicillins  Anaphylaxis and Swelling    Has patient had a PCN reaction causing immediate rash, facial/tongue/throat swelling, SOB or lightheadedness with hypotension: Yes Has patient had a PCN reaction causing severe rash involving mucus membranes or skin necrosis: No Has patient had a PCN reaction that required hospitalization: No Has patient had a PCN reaction occurring within the last 10 years: No If all of the above answers are NO, then may proceed with Cephalosporin use.    Pineapple Anaphylaxis, Hives and Swelling   Lisinopril Cough     Review of Systems    Objective:   BP (!) 150/70 (BP Location: Right Arm, Patient Position: Sitting, Cuff Size:  Normal)   Pulse (!) 22   Resp 20   Ht 6' (1.829 m)   Wt 212 lb 12 oz (96.5 kg)   BMI 28.85 kg/m   Physical Exam NAD HEENT:  PERRL, EOMI, TMs pearly gray, nasal mucosa without swelling or erythema.  Throat without injection Neck:  Supple, No adenopathy, no thyromegaly Chest:  CTA save for crackles R posterior lower lung field.  No wheeze CV:  RRR with normal S1 and S2, No S3, S4 or murmur/rub.  No carotid bruits.  Carotid, radial and DP pulses normal and equal.  No JVD. Abdomen:  protuberant.  No obvious shifting dullness. S, NT, No HSM or mass, + BS LE:  No edema   ECG:  first degree HB.  Appears essentially unchanged from November 2024.    Assessment & Plan   Chest discomfort:  Difficult to get a good history today.  Not clear if lung findings related to left sided chest discomfort and today he cannot say that it actually is a discomfort.  No signs of CHF.  Discussed his bp meds are not too much and not the cause of his symptoms.  CBC, CMP, A1C  2.  Hypertension:  not controlled.  Refilled Losartan  at 100 mg tab instead of two 50 tabs.  Refilled Amlodipine .  Hold on restarting hydrochlorothiazide  and will have him come back in 2 weeks to reassess.  3.  RLL pneumonia:  with severe penicillin allergy, will choose Moxifloxacin  400 mg daily for 7 days.  2 week F/U.  Call if worsens.    4.  DM:  A1C  5.  Alcohol use:  drinking a 40 oz daily.  Encouraged him to bring down to 12 oz daily with all his chronic health issues.  CMP

## 2023-11-09 ENCOUNTER — Ambulatory Visit: Payer: Self-pay | Admitting: Internal Medicine

## 2023-11-09 LAB — CBC WITH DIFFERENTIAL/PLATELET
Basophils Absolute: 0.1 x10E3/uL (ref 0.0–0.2)
Basos: 1 %
EOS (ABSOLUTE): 0.3 x10E3/uL (ref 0.0–0.4)
Eos: 3 %
Hematocrit: 43.3 % (ref 37.5–51.0)
Hemoglobin: 14 g/dL (ref 13.0–17.7)
Immature Grans (Abs): 0 x10E3/uL (ref 0.0–0.1)
Immature Granulocytes: 0 %
Lymphocytes Absolute: 2 x10E3/uL (ref 0.7–3.1)
Lymphs: 22 %
MCH: 30.5 pg (ref 26.6–33.0)
MCHC: 32.3 g/dL (ref 31.5–35.7)
MCV: 94 fL (ref 79–97)
Monocytes Absolute: 0.6 x10E3/uL (ref 0.1–0.9)
Monocytes: 6 %
Neutrophils Absolute: 6.4 x10E3/uL (ref 1.4–7.0)
Neutrophils: 68 %
Platelets: 306 x10E3/uL (ref 150–450)
RBC: 4.59 x10E6/uL (ref 4.14–5.80)
RDW: 12.6 % (ref 11.6–15.4)
WBC: 9.4 x10E3/uL (ref 3.4–10.8)

## 2023-11-09 LAB — COMPREHENSIVE METABOLIC PANEL WITH GFR
ALT: 16 IU/L (ref 0–44)
AST: 14 IU/L (ref 0–40)
Albumin: 4.5 g/dL (ref 3.9–4.9)
Alkaline Phosphatase: 89 IU/L (ref 44–121)
BUN/Creatinine Ratio: 13 (ref 10–24)
BUN: 14 mg/dL (ref 8–27)
Bilirubin Total: 0.4 mg/dL (ref 0.0–1.2)
CO2: 20 mmol/L (ref 20–29)
Calcium: 9.5 mg/dL (ref 8.6–10.2)
Chloride: 102 mmol/L (ref 96–106)
Creatinine, Ser: 1.08 mg/dL (ref 0.76–1.27)
Globulin, Total: 2.5 g/dL (ref 1.5–4.5)
Glucose: 95 mg/dL (ref 70–99)
Potassium: 4.5 mmol/L (ref 3.5–5.2)
Sodium: 141 mmol/L (ref 134–144)
Total Protein: 7 g/dL (ref 6.0–8.5)
eGFR: 75 mL/min/1.73 (ref >59)

## 2023-11-09 LAB — HGB A1C W/O EAG: Hgb A1c MFr Bld: 6.8 % — ABNORMAL HIGH (ref 4.8–5.6)

## 2023-11-24 ENCOUNTER — Ambulatory Visit: Admitting: Internal Medicine

## 2023-11-24 VITALS — BP 140/80 | HR 98 | Resp 18 | Ht 73.0 in | Wt 191.0 lb

## 2023-11-24 DIAGNOSIS — J189 Pneumonia, unspecified organism: Secondary | ICD-10-CM

## 2023-11-24 DIAGNOSIS — K644 Residual hemorrhoidal skin tags: Secondary | ICD-10-CM

## 2023-11-24 MED ORDER — WITCH HAZEL-GLYCERIN EX PADS: 1.0000 | MEDICATED_PAD | CUTANEOUS | Status: AC | PRN

## 2023-11-24 NOTE — Progress Notes (Signed)
    Subjective:    Patient ID: Spencer Walton., male   DOB: January 22, 1956, 68 y.o.   MRN: [id number]   HPI   Chest pain and cough with RLL pneumonia.  Resolved with antibiotics.    2.  For 2 days, burning and itching with something about anus.  No anal bleeding  No constipation recently.  States better today, but wants to make sure nothing concerning   Current Meds  Medication Sig   amLODipine  (NORVASC ) 5 MG tablet Take 1 tablet (5 mg total) by mouth daily.   aspirin  EC 81 MG tablet Take 1 tablet (81 mg total) by mouth daily. Swallow whole.   atorvastatin  (LIPITOR) 80 MG tablet TAKE 1 TABLET BY MOUTH ONCE DAILY WITH EVENING MEAL   Empagliflozin -metFORMIN  HCl 5-500 MG TABS 1 tab by mouth twice daily with meal   EPINEPHrine  (EPIPEN  2-PAK) 0.3 mg/0.3 mL IJ SOAJ injection Inject 0.3 mg into the muscle as needed for anaphylaxis.   losartan  (COZAAR ) 100 MG tablet Take 1 tablet (100 mg total) by mouth daily.   methocarbamol  (ROBAXIN -750) 750 MG tablet Take 1 tablet (750 mg total) by mouth every 8 (eight) hours as needed for muscle spasms.   nitroGLYCERIN  (NITROSTAT ) 0.4 MG SL tablet Place 1 tablet (0.4 mg total) under the tongue every 5 (five) minutes as needed.   Allergies  Allergen Reactions   Cherry Extract Anaphylaxis, Hives, Swelling and Rash   Fruit & Vegetable Daily [Nutritional Supplements] Anaphylaxis, Hives, Swelling and Rash    Fruit cocktail: Lips swell   Other Anaphylaxis and Swelling    NO NUTS!!!!   Peach [Prunus Persica] Anaphylaxis and Swelling   Pecan Nut (Diagnostic) Shortness Of Breath and Swelling    Throat and tongue swelling   Penicillins Anaphylaxis and Swelling    Has patient had a PCN reaction causing immediate rash, facial/tongue/throat swelling, SOB or lightheadedness with hypotension: Yes Has patient had a PCN reaction causing severe rash involving mucus membranes or skin necrosis: No Has patient had a PCN reaction that required hospitalization: No Has  patient had a PCN reaction occurring within the last 10 years: No If all of the above answers are NO, then may proceed with Cephalosporin use.    Pineapple Anaphylaxis, Hives and Swelling   Lisinopril Cough     Review of Systems    Objective:   BP (!) 140/80 (BP Location: Left Arm, Patient Position: Sitting, Cuff Size: Normal)   Pulse 98   Ht 6' 1 (1.854 m)   Wt 191 lb (86.6 kg)   BMI 25.20 kg/m   Physical Exam Lungs:  CTA CV:  RRR without murmur or rub.  Radial and DP pulses normal and equal. Rectal:  6 O'Clock old skin remnant from external hemorrhoid with mild wetness and inflammation about it.    Assessment & Plan   RLL pneumonia:  resolved with treatment.  No longer with associated CP as well.  2.  Anal irritation:  encouraged avoiding vigorous wiping.  Pat clean and then pat area with witch hazel and air dry.  Call if does not improve

## 2023-11-24 NOTE — Patient Instructions (Signed)
 Call for influenza and COVID vaccines in September/October

## 2023-11-30 ENCOUNTER — Encounter: Payer: Self-pay | Admitting: Podiatry

## 2023-11-30 ENCOUNTER — Ambulatory Visit (INDEPENDENT_AMBULATORY_CARE_PROVIDER_SITE_OTHER): Admitting: Podiatry

## 2023-11-30 DIAGNOSIS — E1142 Type 2 diabetes mellitus with diabetic polyneuropathy: Secondary | ICD-10-CM

## 2023-11-30 DIAGNOSIS — B351 Tinea unguium: Secondary | ICD-10-CM | POA: Diagnosis not present

## 2023-11-30 DIAGNOSIS — M79674 Pain in right toe(s): Secondary | ICD-10-CM

## 2023-11-30 DIAGNOSIS — M79675 Pain in left toe(s): Secondary | ICD-10-CM

## 2023-11-30 NOTE — Progress Notes (Signed)
 This patient presents the office for treatment of his long painful toenails both feet.  Patient states that he has back problems and he is unable to self treat his nails.  Patient states the he is diabetic.SABRA  He presents the office today for  nail care.  General Appearance  Alert, conversant and in no acute stress.  Vascular  Dorsalis pedis and posterior tibial  pulses are  weakly  palpable  bilaterally.  Capillary return is within normal limits  bilaterally. Temperature is within normal limits  bilaterally.  Neurologic  Senn-Weinstein monofilament wire test within normal limits  bilaterally. Muscle power within normal limits bilaterally.  Nails Thick disfigured discolored nails with subungual debris  from hallux to fifth toes bilaterally.  Orthopedic  No limitations of motion  feet .  No crepitus or effusions noted.  No bony pathology or digital deformities noted. Hammer toe 2-5  B/L.  Hallux malleus  B/L  Skin  normotropic skin with no porokeratosis noted bilaterally.  No signs of infections or ulcers noted.   Told him to apply vaseline to heels at home.  Onychomycosis  X 10.   Diabetes    Debride nails. With nail nipper.  Dremel tool used to smooth heel callus/fissuring  B/L.  RTC 3 months.    Cordella Bold DPM

## 2023-12-10 ENCOUNTER — Other Ambulatory Visit: Payer: Self-pay | Admitting: Physical Medicine and Rehabilitation

## 2023-12-26 ENCOUNTER — Other Ambulatory Visit: Payer: Self-pay | Admitting: Internal Medicine

## 2023-12-31 ENCOUNTER — Encounter: Payer: Self-pay | Admitting: Internal Medicine

## 2024-01-06 ENCOUNTER — Other Ambulatory Visit: Payer: Self-pay

## 2024-01-06 MED ORDER — EMPAGLIFLOZIN-METFORMIN HCL 5-500 MG PO TABS: ORAL_TABLET | ORAL | 11 refills | Status: AC

## 2024-01-14 ENCOUNTER — Encounter: Payer: Self-pay | Admitting: Cardiology

## 2024-01-14 ENCOUNTER — Ambulatory Visit: Attending: Cardiology | Admitting: Cardiology

## 2024-01-14 VITALS — BP 120/70 | HR 89 | Resp 16 | Ht 73.0 in | Wt 213.0 lb

## 2024-01-14 DIAGNOSIS — E782 Mixed hyperlipidemia: Secondary | ICD-10-CM

## 2024-01-14 DIAGNOSIS — F1010 Alcohol abuse, uncomplicated: Secondary | ICD-10-CM | POA: Diagnosis not present

## 2024-01-14 DIAGNOSIS — I2510 Atherosclerotic heart disease of native coronary artery without angina pectoris: Secondary | ICD-10-CM | POA: Diagnosis not present

## 2024-01-14 DIAGNOSIS — Q245 Malformation of coronary vessels: Secondary | ICD-10-CM | POA: Diagnosis not present

## 2024-01-14 DIAGNOSIS — I700 Atherosclerosis of aorta: Secondary | ICD-10-CM | POA: Diagnosis not present

## 2024-01-14 DIAGNOSIS — I10 Essential (primary) hypertension: Secondary | ICD-10-CM

## 2024-01-14 DIAGNOSIS — E119 Type 2 diabetes mellitus without complications: Secondary | ICD-10-CM

## 2024-01-14 NOTE — Progress Notes (Signed)
 Cardiology Office Note:  .   Date:  01/14/2024  ID:  Spencer Walton., DOB 04/14/1956, MRN [id number] PCP:  Adella Norris, MD  Former Cardiology Providers: Dr. Candyce Reek [facility] Providers Cardiologist:  Candyce Reek, MD , [facility] (established care 01/14/24) Electrophysiologist:  None  Click to update primary MD,subspecialty MD or APP then REFRESH:1}    Chief Complaint  Patient presents with   Nonobstructive atherosclerosis of coronary artery   Follow-up    History of Present Illness: .   Spencer Walton. is a 68 y.o. 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  Formally under the care of Dr. Candyce Reek who last saw Caprice Wasko. back in August 2023. I am seeing him for the first time to re-establishing care.   Patient being followed by the practice given his coronary artery disease.  He last saw Jackee Alberts nurse practitioner in September 2024 and presents today for follow-up and to reestablish care.  Patient is accompanied by his wife for today's office visit. Since last office visit patient denies anginal chest pain or heart failure symptoms. No near-syncope or syncopal event. No hospitalizations for cardiovascular reasons. Overall functional capacity remains relatively stable, patient states that he still mows his lawn with a push mower without any cardiovascular symptoms. No use of sublingual nitroglycerin  tablets since the last office visit. Unfortunately, he consumes at least 40 ounces of alcohol every other day, according to his wife  Review of Systems: .   Review of Systems  Cardiovascular:  Negative for chest pain, claudication, irregular heartbeat, leg swelling, near-syncope, orthopnea, palpitations, paroxysmal nocturnal  dyspnea and syncope.  Respiratory:  Negative for shortness of breath.   Hematologic/Lymphatic: Negative for bleeding problem.   Studies Reviewed:   EKG: 11/2023 (not done today) Sinus rhythm, first-degree AV block, without underlying ischemia  Echocardiogram: 10/2022 1. Left ventricular ejection fraction, by estimation, is 65 to 70%. Left ventricular ejection fraction by PLAX is 67 %. The left ventricle has normal function. The left ventricle has no regional wall motion abnormalities. Left ventricular diastolic parameters are consistent with Grade I diastolic dysfunction (impaired relaxation). 2. Right ventricular systolic function is normal. The right ventricular size is normal. Tricuspid regurgitation signal is inadequate for assessing PA pressure. 3. The mitral valve is abnormal. Trivial mitral valve regurgitation. 4. The aortic valve is tricuspid. Aortic valve regurgitation is not visualized. 5. The inferior vena cava is normal in size with greater than 50% respiratory variability, suggesting right atrial pressure of 3 mmHg.  LEFT HEART CATH AND CORONARY ANGIOGRAPHY 12/09/2021   RCA originates from the mid LAD- unable to show this in the diagram.   Mid LAD lesion is 25% stenosed.   Prox Cx lesion is 25% stenosed.   1st Diag lesion is 50% stenosed. Small vessel.   The left ventricular systolic function is normal.   LV end diastolic pressure is normal.   The left ventricular ejection fraction is 55-65% by visual estimate.   There is no aortic valve stenosis.   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   Nonobstructive disease noted in the mid LAD.  Despite the high risk CTA, his cardiac cath is consistent with his lack of exertional symptoms recently.  Continue medical therapy.  Hold metformin  for 48 hours.  Cardiac monitor: LONG TERM MONITOR (3-14 DAYS) 10/06/2022   Normal sinus rhythm and sinus tachycardia.    Rare PACs and rare PVCs.   Brief run of PACs.  No symptoms reported.   No sustained arrhythmias.  RADIOLOGY: NA  Risk Assessment/Calculations:   NA   Labs:       Latest Ref Rng & Units 11/08/2023    5:07 PM 03/09/2023    9:51 AM 06/05/2022   11:22 AM  CBC  WBC 3.4 - 10.8 x10E3/uL 9.4  9.7  5.0   Hemoglobin 13.0 - 17.7 g/dL 85.9  85.3  86.4   Hematocrit 37.5 - 51.0 % 43.3  44.0  40.5   Platelets 150 - 450 x10E3/uL 306  266  232        Latest Ref Rng & Units 11/08/2023    5:07 PM 03/09/2023    9:51 AM 06/05/2022   11:22 AM  BMP  Glucose 70 - 99 mg/dL 95  868  876   BUN 8 - 27 mg/dL 14  13  15    Creatinine 0.76 - 1.27 mg/dL 8.91  9.07  9.06   BUN/Creat Ratio 10 - 24 13  14  16    Sodium 134 - 144 mmol/L 141  139  137   Potassium 3.5 - 5.2 mmol/L 4.5  4.0  3.8   Chloride 96 - 106 mmol/L 102  101  100   CO2 20 - 29 mmol/L 20  24  21    Calcium  8.6 - 10.2 mg/dL 9.5  9.6  9.2       Latest Ref Rng & Units 11/08/2023    5:07 PM 03/09/2023    9:51 AM 06/05/2022   11:22 AM  CMP  Glucose 70 - 99 mg/dL 95  868  876   BUN 8 - 27 mg/dL 14  13  15    Creatinine 0.76 - 1.27 mg/dL 8.91  9.07  9.06   Sodium 134 - 144 mmol/L 141  139  137   Potassium 3.5 - 5.2 mmol/L 4.5  4.0  3.8   Chloride 96 - 106 mmol/L 102  101  100   CO2 20 - 29 mmol/L 20  24  21    Calcium  8.6 - 10.2 mg/dL 9.5  9.6  9.2   Total Protein 6.0 - 8.5 g/dL 7.0  6.8  7.1   Total Bilirubin 0.0 - 1.2 mg/dL 0.4  0.7  0.6   Alkaline Phos 44 - 121 IU/L 89  78  87   AST 0 - 40 IU/L 14  20  22    ALT 0 - 44 IU/L 16  22  22      Lab Results  Component Value Date   CHOL 128 03/09/2023   HDL 45 03/09/2023   LDLCALC 70 03/09/2023   TRIG 63 03/09/2023   No results for input(s): LIPOA in the last 8760 hours. No components found for: NTPROBNP No results for input(s): PROBNP in the last 8760 hours. No results for input(s): TSH in the last 8760 hours.  Physical Exam:    Today's Vitals   01/14/24 1006  BP: 120/70  Pulse:  89  Resp: 16  SpO2: 96%  Weight: 213 lb (96.6 kg)  Height: 6' 1 (1.854 m)   Body mass index is 28.1 kg/m. Wt Readings from Last 3 Encounters:  01/14/24 213 lb (96.6 kg)  11/24/23 191 lb (86.6 kg)  11/08/23 212 lb 12 oz (  96.5 kg)    Physical Exam  Constitutional: No distress.  hemodynamically stable  Neck: No JVD present.  Cardiovascular: Normal rate, regular rhythm, S1 normal and S2 normal. Exam reveals no gallop, no S3 and no S4.  No murmur heard. Pulmonary/Chest: Effort normal and breath sounds normal. No stridor. He has no wheezes. He has no rales.  Musculoskeletal:        General: No edema.     Cervical back: Neck supple.  Skin: Skin is warm.   Impression & Recommendation(s):  Impression:   ICD-10-CM   1. Nonobstructive atherosclerosis of coronary artery  I25.10 CANCELED: EKG 12-Lead    2. Aortic atherosclerosis (HCC)  I70.0     3. Alcohol abuse  F10.10     4. Essential hypertension  I10     5. Mixed hyperlipidemia  E78.2     6. 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 Recent ECG from July 2025 independently reviewed, nonischemic No use of sublingual nitroglycerin  tablets since the last office visit. Continue amlodipine  5 mg p.o. daily. Continue losartan  100 mg p.o. daily. Continue aspirin  81 mg p.o. daily. Continue Lipitor 80 mg p.o. nightly Patient plans to have repeat labs with PCP in November 2025.  Alcohol abuse Consumes 40 ounces of alcohol every other day. Reemphasize importance of complete cessation.  Otherwise no more than 2 standard drinks per day.  Essential hypertension Office blood pressures are very well-controlled. Medications as discussed above. Reemphasized importance of  low-salt diet.  Mixed hyperlipidemia Currently on Lipitor 80 mg p.o. nightly. LDL 70 mg/dL as of November 7975. Will have repeat labs in November 2025. Does not endorse myalgias. Recommend a goal LDL less than 70 mg/dL or if possible close to 55 mg/dL  Type 2 diabetes mellitus without complication, without long-term current use of insulin  (HCC) Reemphasize importance of glycemic control. Last hemoglobin A1c 6.8 as of July 2025.  Orders Placed:  No orders of the defined types were placed in this encounter.    Final Medication List:   No orders of the defined types were placed in this encounter.   There are no discontinued medications.   Current Outpatient Medications:    amLODipine  (NORVASC ) 5 MG tablet, Take 1 tablet (5 mg total) by mouth daily., Disp: 90 tablet, Rfl: 3   aspirin  EC 81 MG tablet, Take 1 tablet (81 mg total) by mouth daily. Swallow whole., Disp: 90 tablet, Rfl: 3   atorvastatin  (LIPITOR) 80 MG tablet, TAKE 1 TABLET BY MOUTH ONCE DAILY WITH EVENING MEAL, Disp: 90 tablet, Rfl: 3   Empagliflozin -metFORMIN  HCl 5-500 MG TABS, 1 tab by mouth twice daily with meal, Disp: 60 tablet, Rfl: 11   EPINEPHrine  (EPIPEN  2-PAK) 0.3 mg/0.3 mL IJ SOAJ injection, Inject 0.3 mg into the muscle as needed for anaphylaxis., Disp: 1 each, Rfl: 1   losartan  (COZAAR ) 100 MG tablet, Take 1 tablet (100 mg total) by mouth daily., Disp: 90 tablet, Rfl: 3   methocarbamol  (ROBAXIN -750) 750 MG tablet, Take 1 tablet (750 mg total) by mouth every 8 (eight) hours as needed for muscle spasms., Disp: 90 tablet, Rfl: 3   moxifloxacin  (AVELOX ) 400 MG tablet, Take 1 tablet (400 mg total) by mouth daily., Disp: 7 tablet, Rfl: 0   nitroGLYCERIN  (NITROSTAT ) 0.4  MG SL tablet, Place 1 tablet (0.4 mg total) under the tongue every 5 (five) minutes as needed., Disp: 25 tablet, Rfl: 3   witch hazel-glycerin  (TUCKS) pad, Apply 1 Application topically as needed for itching. Use after bowel movement and if irritation.,  Disp: , Rfl:   Consent:   NA  Disposition:   Follow-up in December 2026, sooner if needed  His questions and concerns were addressed to his satisfaction. He voices understanding of the recommendations provided during this encounter.    Signed, Madonna Michele HAS, [facility]  A Division of [facility] [address], [place], [place] [address]  01/14/2024 1:10 PM

## 2024-01-14 NOTE — Patient Instructions (Signed)
 Medication Instructions:  Your physician recommends that you continue on your current medications as directed. Please refer to the Current Medication list given to you today.  *If you need a refill on your cardiac medications before your next appointment, please call your pharmacy*  Lab Work: If you have labs (blood work) drawn today and your tests are completely normal, you will receive your results only by: MyChart Message (if you have MyChart) OR A paper copy in the mail If you have any lab test that is abnormal or we need to change your treatment, we will call you to review the results.  Testing/Procedures: None ordered today.  Follow-Up: At [facility], you and your health needs are our priority.  As part of our continuing mission to provide you with exceptional heart care, our providers are all part of one team.  This team includes your primary Cardiologist (physician) and Advanced Practice Providers or APPs (Physician Assistants and Nurse Practitioners) who all work together to provide you with the care you need, when you need it.  Your next appointment:   December 2026  Provider:   Dr. Michele  We recommend signing up for the patient portal called MyChart.  Sign up information is provided on this After Visit Summary.  MyChart is used to connect with patients for Virtual Visits (Telemedicine).  Patients are able to view lab/test results, encounter notes, upcoming appointments, etc.  Non-urgent messages can be sent to your provider as well.   To learn more about what you can do with MyChart, go to [email or web address].   Other Instructions

## 2024-03-01 ENCOUNTER — Ambulatory Visit: Admitting: Podiatry

## 2024-03-01 ENCOUNTER — Other Ambulatory Visit: Payer: Self-pay | Admitting: Internal Medicine

## 2024-03-03 ENCOUNTER — Telehealth: Payer: Self-pay | Admitting: Internal Medicine

## 2024-03-03 NOTE — Telephone Encounter (Signed)
 Patient's wife called and states patient needs refills for medication Ibuprofen  .   Wife states patient gets this medication prescribed for pain.

## 2024-03-09 ENCOUNTER — Encounter: Payer: Self-pay | Admitting: Internal Medicine

## 2024-03-09 ENCOUNTER — Ambulatory Visit: Payer: 59 | Admitting: Internal Medicine

## 2024-03-09 VITALS — BP 158/100 | HR 99 | Resp 13 | Ht 71.5 in | Wt 207.0 lb

## 2024-03-09 DIAGNOSIS — E1142 Type 2 diabetes mellitus with diabetic polyneuropathy: Secondary | ICD-10-CM | POA: Diagnosis not present

## 2024-03-09 DIAGNOSIS — I10 Essential (primary) hypertension: Secondary | ICD-10-CM

## 2024-03-09 DIAGNOSIS — Z860101 Personal history of adenomatous and serrated colon polyps: Secondary | ICD-10-CM

## 2024-03-09 DIAGNOSIS — E118 Type 2 diabetes mellitus with unspecified complications: Secondary | ICD-10-CM | POA: Diagnosis not present

## 2024-03-09 DIAGNOSIS — Z0000 Encounter for general adult medical examination without abnormal findings: Secondary | ICD-10-CM | POA: Diagnosis not present

## 2024-03-09 DIAGNOSIS — Z23 Encounter for immunization: Secondary | ICD-10-CM | POA: Diagnosis not present

## 2024-03-09 DIAGNOSIS — E782 Mixed hyperlipidemia: Secondary | ICD-10-CM

## 2024-03-09 MED ORDER — NAPROXEN 500 MG PO TABS: ORAL_TABLET | ORAL | 6 refills | Status: AC

## 2024-03-09 MED ORDER — NITROGLYCERIN 0.4 MG SL SUBL: 0.4000 mg | SUBLINGUAL_TABLET | SUBLINGUAL | 1 refills | Status: AC | PRN

## 2024-03-09 MED ORDER — AMLODIPINE BESYLATE 10 MG PO TABS: 10.0000 mg | ORAL_TABLET | Freq: Every day | ORAL | 3 refills | Status: AC

## 2024-03-09 MED ORDER — PREGABALIN 50 MG PO CAPS: 50.0000 mg | ORAL_CAPSULE | Freq: Two times a day (BID) | ORAL | 11 refills | Status: DC

## 2024-03-09 MED ORDER — EPINEPHRINE 0.3 MG/0.3ML IJ SOAJ: 0.3000 mg | INTRAMUSCULAR | 1 refills | Status: AC | PRN
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
## 2024-03-17 ENCOUNTER — Encounter: Admitting: Internal Medicine

## 2024-03-22 NOTE — Telephone Encounter (Signed)
 Patient has been notified

## 2024-03-23 MED ORDER — PREGABALIN 50 MG PO CAPS: 50.0000 mg | ORAL_CAPSULE | Freq: Two times a day (BID) | ORAL | 5 refills | Status: AC

## 2024-03-24 ENCOUNTER — Ambulatory Visit (INDEPENDENT_AMBULATORY_CARE_PROVIDER_SITE_OTHER): Admitting: Internal Medicine

## 2024-03-24 VITALS — BP 130/80 | HR 82

## 2024-03-24 DIAGNOSIS — Z0130 Encounter for examination of blood pressure without abnormal findings: Secondary | ICD-10-CM

## 2024-03-24 DIAGNOSIS — Z0000 Encounter for general adult medical examination without abnormal findings: Secondary | ICD-10-CM | POA: Diagnosis not present

## 2024-03-24 NOTE — Progress Notes (Signed)
 Patient reports that he is taking bp medication consistently. Patient did not take medication this morning.

## 2024-03-25 LAB — LIPID PANEL W/O CHOL/HDL RATIO

## 2024-03-28 LAB — LIPID PANEL W/O CHOL/HDL RATIO
Cholesterol, Total: 151 mg/dL (ref 100–199)
HDL: 50 mg/dL (ref >39)
LDL Chol Calc (NIH): 87 mg/dL (ref 0–99)
Triglycerides: 69 mg/dL (ref 0–149)
VLDL Cholesterol Cal: 14 mg/dL (ref 5–40)

## 2024-03-28 LAB — COMPREHENSIVE METABOLIC PANEL WITH GFR
ALT: 26 IU/L (ref 0–44)
AST: 18 IU/L (ref 0–40)
Albumin: 4.8 g/dL (ref 3.9–4.9)
Alkaline Phosphatase: 63 IU/L (ref 47–123)
BUN/Creatinine Ratio: 21 (ref 10–24)
BUN: 18 mg/dL (ref 8–27)
Bilirubin Total: 0.6 mg/dL (ref 0.0–1.2)
CO2: 22 mmol/L (ref 20–29)
Calcium: 9.7 mg/dL (ref 8.6–10.2)
Chloride: 101 mmol/L (ref 96–106)
Creatinine, Ser: 0.86 mg/dL (ref 0.76–1.27)
Globulin, Total: 2.3 g/dL (ref 1.5–4.5)
Glucose: 114 mg/dL — ABNORMAL HIGH (ref 70–99)
Potassium: 4.2 mmol/L (ref 3.5–5.2)
Sodium: 137 mmol/L (ref 134–144)
Total Protein: 7.1 g/dL (ref 6.0–8.5)
eGFR: 95 mL/min/1.73 (ref >59)

## 2024-03-28 LAB — CBC WITH DIFFERENTIAL/PLATELET
Basophils Absolute: 0.1 x10E3/uL (ref 0.0–0.2)
Basos: 1 %
EOS (ABSOLUTE): 0.5 x10E3/uL — ABNORMAL HIGH (ref 0.0–0.4)
Eos: 7 %
Hematocrit: 45.7 % (ref 37.5–51.0)
Hemoglobin: 14.8 g/dL (ref 13.0–17.7)
Immature Grans (Abs): 0 x10E3/uL (ref 0.0–0.1)
Immature Granulocytes: 0 %
Lymphocytes Absolute: 1.9 x10E3/uL (ref 0.7–3.1)
Lymphs: 27 %
MCH: 31 pg (ref 26.6–33.0)
MCHC: 32.4 g/dL (ref 31.5–35.7)
MCV: 96 fL (ref 79–97)
Monocytes Absolute: 0.5 x10E3/uL (ref 0.1–0.9)
Monocytes: 7 %
Neutrophils Absolute: 4 x10E3/uL (ref 1.4–7.0)
Neutrophils: 58 %
Platelets: 244 x10E3/uL (ref 150–450)
RBC: 4.78 x10E6/uL (ref 4.14–5.80)
RDW: 12.1 % (ref 11.6–15.4)
WBC: 6.9 x10E3/uL (ref 3.4–10.8)

## 2024-03-28 LAB — HEMOGLOBIN A1C
Est. average glucose Bld gHb Est-mCnc: 143 mg/dL
Hgb A1c MFr Bld: 6.6 % — ABNORMAL HIGH (ref 4.8–5.6)

## 2024-03-28 LAB — MICROALBUMIN / CREATININE URINE RATIO
Creatinine, Urine: 52.9 mg/dL
Microalb/Creat Ratio: 97 mg/g{creat} — ABNORMAL HIGH (ref 0–29)
Microalbumin, Urine: 51.3 ug/mL

## 2024-05-22 ENCOUNTER — Ambulatory Visit: Payer: Self-pay | Admitting: Internal Medicine

## 2024-06-20 ENCOUNTER — Ambulatory Visit: Admitting: Internal Medicine

## 2024-06-22 ENCOUNTER — Ambulatory Visit: Admitting: Podiatry

## 2024-09-01 ENCOUNTER — Other Ambulatory Visit

## 2024-09-06 ENCOUNTER — Ambulatory Visit: Admitting: Internal Medicine

## 2025-03-09 ENCOUNTER — Other Ambulatory Visit: Admitting: Internal Medicine

## 2025-03-12 ENCOUNTER — Encounter: Admitting: Internal Medicine
